# Patient Record
Sex: Female | Born: 1985 | Hispanic: Yes | Marital: Married | Smoking: Never smoker
Health system: Southern US, Community
[De-identification: ages and names within clinical notes are randomized; demographics above are authoritative.]

## PROBLEM LIST (undated history)

## (undated) DIAGNOSIS — Z789 Other specified health status: Secondary | ICD-10-CM

## (undated) HISTORY — PX: NO PAST SURGERIES: SHX2092

---

## 2013-01-07 LAB — OB RESULTS CONSOLE RUBELLA ANTIBODY, IGM: Rubella: IMMUNE

## 2013-01-07 LAB — OB RESULTS CONSOLE ABO/RH: RH TYPE: POSITIVE

## 2013-01-07 LAB — OB RESULTS CONSOLE GC/CHLAMYDIA
Chlamydia: NEGATIVE
Gonorrhea: NEGATIVE

## 2013-01-07 LAB — OB RESULTS CONSOLE RPR: RPR: NONREACTIVE

## 2013-01-07 LAB — OB RESULTS CONSOLE ANTIBODY SCREEN: Antibody Screen: NEGATIVE

## 2013-01-07 LAB — OB RESULTS CONSOLE HEPATITIS B SURFACE ANTIGEN: HEP B S AG: NEGATIVE

## 2013-01-07 LAB — OB RESULTS CONSOLE HIV ANTIBODY (ROUTINE TESTING): HIV: NONREACTIVE

## 2013-01-08 ENCOUNTER — Inpatient Hospital Stay (HOSPITAL_COMMUNITY): Admission: AD | Admit: 2013-01-08 | Payer: Self-pay | Source: Ambulatory Visit | Admitting: Obstetrics

## 2013-07-15 NOTE — L&D Delivery Note (Signed)
Delivery Note At 12:45 AM a viable female was delivered via Vaginal, Vacuum (Extractor) (Presentation: ;  ).  APGAR: 7, 9; weight .   Placenta status: Intact, Spontaneous.  Cord: Unknown with the following complications: None.  Cord pH: not done  Anesthesia: Epidural  Episiotomy: Median Lacerations: 3rd degree Suture Repair: 2.0 vicryl Est. Blood Loss (mL): 250  Mom to postpartum.  Baby to Couplet care / Skin to Skin.  Jamey Harman A 08/30/2013, 1:07 AM

## 2013-08-27 ENCOUNTER — Encounter (HOSPITAL_COMMUNITY): Payer: Self-pay | Admitting: *Deleted

## 2013-08-27 ENCOUNTER — Telehealth (HOSPITAL_COMMUNITY): Payer: Self-pay | Admitting: *Deleted

## 2013-08-27 NOTE — Telephone Encounter (Signed)
Preadmission screen  

## 2013-08-27 NOTE — Telephone Encounter (Signed)
Interpreter number [phone]

## 2013-08-29 ENCOUNTER — Encounter (HOSPITAL_COMMUNITY): Payer: Medicaid Other | Admitting: Anesthesiology

## 2013-08-29 ENCOUNTER — Encounter (HOSPITAL_COMMUNITY): Payer: Self-pay

## 2013-08-29 ENCOUNTER — Inpatient Hospital Stay (HOSPITAL_COMMUNITY): Payer: Medicaid Other | Admitting: Anesthesiology

## 2013-08-29 ENCOUNTER — Inpatient Hospital Stay (HOSPITAL_COMMUNITY)
Admission: RE | Admit: 2013-08-29 | Discharge: 2013-08-31 | DRG: 775 | Disposition: A | Discharge status: Home / Self Care | Payer: Medicaid Other | Source: Ambulatory Visit | Attending: Obstetrics | Admitting: Obstetrics

## 2013-08-29 DIAGNOSIS — O480 Post-term pregnancy: Principal | ICD-10-CM | POA: Diagnosis present

## 2013-08-29 DIAGNOSIS — Z3490 Encounter for supervision of normal pregnancy, unspecified, unspecified trimester: Secondary | ICD-10-CM

## 2013-08-29 DIAGNOSIS — O0990 Supervision of high risk pregnancy, unspecified, unspecified trimester: Secondary | ICD-10-CM

## 2013-08-29 HISTORY — DX: Other specified health status: Z78.9

## 2013-08-29 LAB — RPR: RPR Ser Ql: NONREACTIVE

## 2013-08-29 LAB — CBC
HCT: 35.7 % — ABNORMAL LOW (ref 36.0–46.0)
Hemoglobin: 12 g/dL (ref 12.0–15.0)
MCH: 28 pg (ref 26.0–34.0)
MCHC: 33.6 g/dL (ref 30.0–36.0)
MCV: 83.4 fL (ref 78.0–100.0)
PLATELETS: 156 10*3/uL (ref 150–400)
RBC: 4.28 MIL/uL (ref 3.87–5.11)
RDW: 14.4 % (ref 11.5–15.5)
WBC: 7.5 10*3/uL (ref 4.0–10.5)

## 2013-08-29 LAB — OB RESULTS CONSOLE GBS: STREP GROUP B AG: NEGATIVE

## 2013-08-29 LAB — TYPE AND SCREEN
ABO/RH(D): O POS
Antibody Screen: NEGATIVE

## 2013-08-29 LAB — ABO/RH: ABO/RH(D): O POS

## 2013-08-29 MED ORDER — LACTATED RINGERS IV SOLN: 500.0000 mL | INTRAVENOUS | Status: DC | PRN

## 2013-08-29 MED ORDER — LACTATED RINGERS IV SOLN
INTRAVENOUS | Status: DC
  Administered 2013-08-29 (×2): via INTRAVENOUS

## 2013-08-29 MED ORDER — LIDOCAINE HCL (PF) 1 % IJ SOLN
30.0000 mL | INTRAMUSCULAR | Status: DC | PRN
  Administered 2013-08-30: 30 mL via SUBCUTANEOUS
  Filled 2013-08-29: qty 30

## 2013-08-29 MED ORDER — LIDOCAINE HCL (PF) 1 % IJ SOLN
INTRAMUSCULAR | Status: DC | PRN
  Administered 2013-08-29 (×2): 9 mL

## 2013-08-29 MED ORDER — OXYTOCIN BOLUS FROM INFUSION
500.0000 mL | INTRAVENOUS | Status: DC
  Administered 2013-08-30: 500 mL via INTRAVENOUS

## 2013-08-29 MED ORDER — LACTATED RINGERS IV SOLN: 500.0000 mL | Freq: Once | INTRAVENOUS | Status: DC

## 2013-08-29 MED ORDER — EPHEDRINE 5 MG/ML INJ
10.0000 mg | INTRAVENOUS | Status: DC | PRN
  Filled 2013-08-29: qty 2
  Filled 2013-08-29: qty 4

## 2013-08-29 MED ORDER — DIPHENHYDRAMINE HCL 50 MG/ML IJ SOLN: 12.5000 mg | INTRAMUSCULAR | Status: DC | PRN

## 2013-08-29 MED ORDER — OXYTOCIN 40 UNITS IN LACTATED RINGERS INFUSION - SIMPLE MED
62.5000 mL/h | INTRAVENOUS | Status: DC
  Administered 2013-08-30: 62.5 mL/h via INTRAVENOUS

## 2013-08-29 MED ORDER — FENTANYL 2.5 MCG/ML BUPIVACAINE 1/10 % EPIDURAL INFUSION (WH - ANES)
14.0000 mL/h | INTRAMUSCULAR | Status: DC | PRN
  Filled 2013-08-29: qty 125

## 2013-08-29 MED ORDER — IBUPROFEN 600 MG PO TABS: 600.0000 mg | ORAL_TABLET | Freq: Four times a day (QID) | ORAL | Status: DC | PRN

## 2013-08-29 MED ORDER — EPHEDRINE 5 MG/ML INJ
10.0000 mg | INTRAVENOUS | Status: DC | PRN
  Filled 2013-08-29: qty 2

## 2013-08-29 MED ORDER — FLEET ENEMA 7-19 GM/118ML RE ENEM: 1.0000 | ENEMA | RECTAL | Status: DC | PRN

## 2013-08-29 MED ORDER — BUTORPHANOL TARTRATE 1 MG/ML IJ SOLN
1.0000 mg | INTRAMUSCULAR | Status: DC | PRN
  Administered 2013-08-29 (×2): 1 mg via INTRAVENOUS
  Filled 2013-08-29 (×2): qty 1

## 2013-08-29 MED ORDER — CITRIC ACID-SODIUM CITRATE 334-500 MG/5ML PO SOLN: 30.0000 mL | ORAL | Status: DC | PRN

## 2013-08-29 MED ORDER — PHENYLEPHRINE 40 MCG/ML (10ML) SYRINGE FOR IV PUSH (FOR BLOOD PRESSURE SUPPORT)
80.0000 ug | PREFILLED_SYRINGE | INTRAVENOUS | Status: DC | PRN
  Filled 2013-08-29: qty 2

## 2013-08-29 MED ORDER — ACETAMINOPHEN 325 MG PO TABS: 650.0000 mg | ORAL_TABLET | ORAL | Status: DC | PRN

## 2013-08-29 MED ORDER — ONDANSETRON HCL 4 MG/2ML IJ SOLN: 4.0000 mg | Freq: Four times a day (QID) | INTRAMUSCULAR | Status: DC | PRN

## 2013-08-29 MED ORDER — TERBUTALINE SULFATE 1 MG/ML IJ SOLN
0.2500 mg | Freq: Once | INTRAMUSCULAR | Status: AC | PRN
Start: 2013-08-29 — End: 2013-08-29

## 2013-08-29 MED ORDER — FENTANYL 2.5 MCG/ML BUPIVACAINE 1/10 % EPIDURAL INFUSION (WH - ANES)
INTRAMUSCULAR | Status: DC | PRN
  Administered 2013-08-29: 14 mL/h via EPIDURAL

## 2013-08-29 MED ORDER — PROMETHAZINE HCL 25 MG/ML IJ SOLN
12.5000 mg | Freq: Four times a day (QID) | INTRAMUSCULAR | Status: DC | PRN
  Administered 2013-08-29: 12.5 mg via INTRAVENOUS
  Filled 2013-08-29: qty 1

## 2013-08-29 MED ORDER — PHENYLEPHRINE 40 MCG/ML (10ML) SYRINGE FOR IV PUSH (FOR BLOOD PRESSURE SUPPORT)
80.0000 ug | PREFILLED_SYRINGE | INTRAVENOUS | Status: DC | PRN
  Filled 2013-08-29: qty 10
  Filled 2013-08-29: qty 2

## 2013-08-29 MED ORDER — OXYTOCIN 40 UNITS IN LACTATED RINGERS INFUSION - SIMPLE MED
1.0000 m[IU]/min | INTRAVENOUS | Status: DC
Start: 2013-08-29 — End: 2013-08-30
  Administered 2013-08-29: 2 m[IU]/min via INTRAVENOUS
  Filled 2013-08-29: qty 1000

## 2013-08-29 MED ORDER — OXYCODONE-ACETAMINOPHEN 5-325 MG PO TABS: 1.0000 | ORAL_TABLET | ORAL | Status: DC | PRN

## 2013-08-29 NOTE — Anesthesia Procedure Notes (Signed)
Epidural Patient location during procedure: OB Start time: 08/29/2013 5:55 PM End time: 08/29/2013 5:59 PM  Staffing Anesthesiologist: Leilani AbleHATCHETT, Apurva Reily Performed by: anesthesiologist   Preanesthetic Checklist Completed: patient identified, surgical consent, pre-op evaluation, timeout performed, IV checked, risks and benefits discussed and monitors and equipment checked  Epidural Patient position: sitting Prep: site prepped and draped and DuraPrep Patient monitoring: continuous pulse ox and blood pressure Approach: midline Injection technique: LOR air  Needle:  Needle type: Tuohy  Needle gauge: 17 G Needle length: 9 cm and 9 Needle insertion depth: 5 cm cm Catheter type: closed end flexible Catheter size: 19 Gauge Catheter at skin depth: 10 cm Test dose: negative and Other  Assessment Sensory level: T9 Events: blood not aspirated, injection not painful, no injection resistance, negative IV test and no paresthesia  Additional Notes Reason for block:procedure for pain

## 2013-08-29 NOTE — Anesthesia Preprocedure Evaluation (Signed)
Anesthesia Evaluation  Patient identified by MRN, date of birth, ID band Patient awake    Reviewed: Allergy & Precautions, H&P , NPO status , Patient's Chart, lab work & pertinent test results  Airway Mallampati: II TM Distance: >3 FB Neck ROM: full    Dental no notable dental hx.    Pulmonary neg pulmonary ROS,    Pulmonary exam normal       Cardiovascular negative cardio ROS      Neuro/Psych negative neurological ROS  negative psych ROS   GI/Hepatic negative GI ROS, Neg liver ROS,   Endo/Other  negative endocrine ROS  Renal/GU negative Renal ROS  negative genitourinary   Musculoskeletal negative musculoskeletal ROS (+)   Abdominal Normal abdominal exam  (+)   Peds  Hematology negative hematology ROS (+)   Anesthesia Other Findings   Reproductive/Obstetrics (+) Pregnancy                           Anesthesia Physical Anesthesia Plan  ASA: II  Anesthesia Plan: Epidural   Post-op Pain Management:    Induction:   Airway Management Planned:   Additional Equipment:   Intra-op Plan:   Post-operative Plan:   Informed Consent: I have reviewed the patients History and Physical, chart, labs and discussed the procedure including the risks, benefits and alternatives for the proposed anesthesia with the patient or authorized representative who has indicated his/her understanding and acceptance.     Plan Discussed with:   Anesthesia Plan Comments:         Anesthesia Quick Evaluation  

## 2013-08-29 NOTE — H&P (Signed)
This is Dr. Francoise CeoBernard Arsenio Schnorr dictating the history and physical on  Emily [name] she is a 28 year old gravida 1 at 41 weeks and 2 days EDC to 6:15 negative GBS brought in for induction cervix is 3 cm 90% 0 station amniotomy performed the fluids clear she is on low-dose Pitocin contracting irregularly Past medical history negative Past surgical history negative Social history negative System review noncontributory Physical exam well-developed female in no distress HEENT negative Lungs clear to P&A Heart regular rhythm no murmurs no gallops Breasts negative Abdomen term Extremities negative and and and and

## 2013-08-30 ENCOUNTER — Encounter (HOSPITAL_COMMUNITY): Payer: Self-pay

## 2013-08-30 LAB — CBC
HCT: 30.3 % — ABNORMAL LOW (ref 36.0–46.0)
Hemoglobin: 10.1 g/dL — ABNORMAL LOW (ref 12.0–15.0)
MCH: 28 pg (ref 26.0–34.0)
MCHC: 33.3 g/dL (ref 30.0–36.0)
MCV: 83.9 fL (ref 78.0–100.0)
PLATELETS: 141 10*3/uL — AB (ref 150–400)
RBC: 3.61 MIL/uL — AB (ref 3.87–5.11)
RDW: 14.5 % (ref 11.5–15.5)
WBC: 13 10*3/uL — ABNORMAL HIGH (ref 4.0–10.5)

## 2013-08-30 MED ORDER — LANOLIN HYDROUS EX OINT: TOPICAL_OINTMENT | CUTANEOUS | Status: DC | PRN

## 2013-08-30 MED ORDER — INFLUENZA VAC SPLIT QUAD 0.5 ML IM SUSP
0.5000 mL | INTRAMUSCULAR | Status: AC
  Administered 2013-08-30: 0.5 mL via INTRAMUSCULAR

## 2013-08-30 MED ORDER — IBUPROFEN 600 MG PO TABS
600.0000 mg | ORAL_TABLET | Freq: Four times a day (QID) | ORAL | Status: DC
  Administered 2013-08-30 – 2013-08-31 (×7): 600 mg via ORAL
  Filled 2013-08-30 (×7): qty 1

## 2013-08-30 MED ORDER — ONDANSETRON HCL 4 MG PO TABS: 4.0000 mg | ORAL_TABLET | ORAL | Status: DC | PRN

## 2013-08-30 MED ORDER — TETANUS-DIPHTH-ACELL PERTUSSIS 5-2.5-18.5 LF-MCG/0.5 IM SUSP
0.5000 mL | Freq: Once | INTRAMUSCULAR | Status: AC
  Administered 2013-08-30: 0.5 mL via INTRAMUSCULAR

## 2013-08-30 MED ORDER — SIMETHICONE 80 MG PO CHEW: 80.0000 mg | CHEWABLE_TABLET | ORAL | Status: DC | PRN

## 2013-08-30 MED ORDER — PRENATAL MULTIVITAMIN CH
1.0000 | ORAL_TABLET | Freq: Every day | ORAL | Status: DC
  Administered 2013-08-30 – 2013-08-31 (×2): 1 via ORAL
  Filled 2013-08-30 (×2): qty 1

## 2013-08-30 MED ORDER — FERROUS SULFATE 325 (65 FE) MG PO TABS
325.0000 mg | ORAL_TABLET | Freq: Two times a day (BID) | ORAL | Status: DC
  Administered 2013-08-30 – 2013-08-31 (×4): 325 mg via ORAL
  Filled 2013-08-30 (×4): qty 1

## 2013-08-30 MED ORDER — SENNOSIDES-DOCUSATE SODIUM 8.6-50 MG PO TABS
2.0000 | ORAL_TABLET | ORAL | Status: DC
  Administered 2013-08-30: 2 via ORAL
  Filled 2013-08-30: qty 2

## 2013-08-30 MED ORDER — DIBUCAINE 1 % RE OINT: 1.0000 "application " | TOPICAL_OINTMENT | RECTAL | Status: DC | PRN

## 2013-08-30 MED ORDER — ONDANSETRON HCL 4 MG/2ML IJ SOLN: 4.0000 mg | INTRAMUSCULAR | Status: DC | PRN

## 2013-08-30 MED ORDER — DIPHENHYDRAMINE HCL 25 MG PO CAPS: 25.0000 mg | ORAL_CAPSULE | Freq: Four times a day (QID) | ORAL | Status: DC | PRN

## 2013-08-30 MED ORDER — BENZOCAINE-MENTHOL 20-0.5 % EX AERO
1.0000 "application " | INHALATION_SPRAY | CUTANEOUS | Status: DC | PRN
  Filled 2013-08-30: qty 56

## 2013-08-30 MED ORDER — OXYCODONE-ACETAMINOPHEN 5-325 MG PO TABS: 1.0000 | ORAL_TABLET | ORAL | Status: DC | PRN

## 2013-08-30 MED ORDER — ZOLPIDEM TARTRATE 5 MG PO TABS
5.0000 mg | ORAL_TABLET | Freq: Every evening | ORAL | Status: DC | PRN
Start: 2013-08-30 — End: 2013-08-31

## 2013-08-30 MED ORDER — WITCH HAZEL-GLYCERIN EX PADS: 1.0000 "application " | MEDICATED_PAD | CUTANEOUS | Status: DC | PRN

## 2013-08-30 NOTE — Progress Notes (Signed)
Ur chart review completed.  

## 2013-08-30 NOTE — Progress Notes (Signed)
Patient ID: Emily AcresMaria Laguna-Espitia, female   DOB: 08/10/1985, 28 y.o.   MRN: [id number] Postpartum day 0 Vital signs normal Fundus firm Doing well and and and and

## 2013-08-30 NOTE — Anesthesia Postprocedure Evaluation (Signed)
Anesthesia Post Note  Patient: Emily [name]  Procedure(s) Performed: * No procedures listed *  Anesthesia type: Epidural  Patient location: Mother/Baby  Post pain: Pain level controlled  Post assessment: Post-op Vital signs reviewed  Last Vitals:  Filed Vitals:   08/30/13 0500  BP: 107/66  Pulse: 100  Temp: 36.9 C  Resp: 20    Post vital signs: Reviewed  Level of consciousness: awake  Complications: No apparent anesthesia complications

## 2013-08-30 NOTE — Lactation Note (Signed)
This note was copied from the chart of Emily [name]. Lactation Consultation Note Initial consult:  Baby 12 hours old.  English speaking niece present and mother comfortable with her translating.  Niece has experience breastfeeding and good support for mother.  Reviewed hand expression, teach back completed, mother has good flow of colostrum.  Assisted mother in placing baby in football hold with breast compression and breast massage.  Baby sucked but did not maintain latch.  Attempted with #20 nipple shield.  Baby latched off and on for 8 min.  Scant amount of colostrum in shield.  Baby no longer cueing at this time. Reviewed nipple shield use and care and the possible need for outpatient appointment if he continues to breast feed with NS.  Reviewed basics, supply and demand, Baby & Me booklet, lactation support services and brochure.  Encouraged mother to call for assistance with next feeding.   Patient Name: Emily [name] ONGEX'BToday's Date: 08/30/2013 Reason for consult: Initial assessment   Maternal Data Has patient been taught Hand Expression?: Yes  Feeding Feeding Type: Breast Fed  LATCH Score/Interventions Latch: Repeated attempts needed to sustain latch, nipple held in mouth throughout feeding, stimulation needed to elicit sucking reflex. Intervention(s): Adjust position;Assist with latch;Breast massage;Breast compression  Audible Swallowing: A few with stimulation Intervention(s): Hand expression  Type of Nipple: Everted at rest and after stimulation  Comfort (Breast/Nipple): Filling, red/small blisters or bruises, mild/mod discomfort  Problem noted: Mild/Moderate discomfort Interventions (Mild/moderate discomfort): Hand expression  Hold (Positioning): Assistance needed to correctly position infant at breast and maintain latch. Intervention(s): Breastfeeding basics reviewed;Support Pillows;Position options;Skin to skin  LATCH Score: 6  Lactation Tools  Discussed/Used Tools: Nipple Shields Nipple shield size: 20   Consult Status Consult Status: Follow-up Date: 08/30/13 Follow-up type: In-patient    Dahlia ByesBerkelhammer, Ruth Labette HealthBoschen 08/30/2013, 1:16 PM

## 2013-08-31 NOTE — Discharge Instructions (Signed)
Discharge instructions ° °· You can wash your hair °· Shower °· Eat what you want °· Drink what you want °· See me in 6 weeks °· Your ankles are going to swell more in the next 2 weeks than when pregnant °· No sex for 6 weeks ° ° °Donaldson Richter A, MD 08/31/2013 ° ° °

## 2013-08-31 NOTE — Discharge Summary (Signed)
Obstetric Discharge Summary Reason for Admission: induction of labor Prenatal Procedures: none Intrapartum Procedures: spontaneous vaginal delivery Postpartum Procedures: none Complications-Operative and Postpartum: none Hemoglobin  Date Value Ref Range Status  08/30/2013 10.1* 12.0 - 15.0 g/dL Final     HCT  Date Value Ref Range Status  08/30/2013 30.3* 36.0 - 46.0 % Final    Physical Exam:  General: alert Lochia: appropriate Uterine Fundus: firm Incision: healing well DVT Evaluation: No evidence of DVT seen on physical exam.  Discharge Diagnoses: Term Pregnancy-delivered  Discharge Information: Date: 08/31/2013 Activity: pelvic rest Diet: routine Medications: Percocet Condition: stable Instructions: refer to practice specific booklet Discharge to: home Follow-up Information   Follow up with Kathreen CosierMARSHALL,Willye Javier A, MD.   Specialty:  Obstetrics and Gynecology   Contact information:   [address] [place] [address] [phone]       Newborn Data: Live born female  Birth Weight: 7 lb 12 oz (3515 g) APGAR: 7, 9  Home with mother.  Kree Armato A 08/31/2013, 7:31 AM

## 2014-05-16 ENCOUNTER — Encounter (HOSPITAL_COMMUNITY): Payer: Self-pay

## 2015-03-28 LAB — OB RESULTS CONSOLE ABO/RH: RH TYPE: POSITIVE

## 2015-03-28 LAB — OB RESULTS CONSOLE RUBELLA ANTIBODY, IGM: Rubella: IMMUNE

## 2015-03-28 LAB — OB RESULTS CONSOLE HIV ANTIBODY (ROUTINE TESTING): HIV: NONREACTIVE

## 2015-03-28 LAB — OB RESULTS CONSOLE RPR: RPR: NONREACTIVE

## 2015-03-28 LAB — PROCEDURE REPORT - SCANNED: PAP SMEAR: NEGATIVE

## 2015-03-28 LAB — OB RESULTS CONSOLE HEPATITIS B SURFACE ANTIGEN: Hepatitis B Surface Ag: NEGATIVE

## 2015-03-28 LAB — OB RESULTS CONSOLE ANTIBODY SCREEN: Antibody Screen: NEGATIVE

## 2015-07-16 NOTE — L&D Delivery Note (Addendum)
Delivery Note At 12:48 PM a viable female was delivered via Vaginal, Spontaneous Delivery (Presentation: ;  ).  APGAR: , ; weight  .   Placenta status: , .  Cord:  with the following complications: .  Cord pH: not done  Anesthesia: Epidural  Episiotomy: None Lacerations: 2nd degreeperineal Suture Repair: 2.0 vicryl Est. Blood Loss (mL):    Mom to postpartum.  Baby to Couplet care / Skin to Skin.  Emily [name] A 10/19/2015, 1:00 PM

## 2015-08-19 ENCOUNTER — Inpatient Hospital Stay (HOSPITAL_COMMUNITY): Admission: AD | Admit: 2015-08-19 | Payer: Medicaid Other | Source: Ambulatory Visit | Admitting: Obstetrics

## 2015-10-17 ENCOUNTER — Other Ambulatory Visit: Payer: Self-pay | Admitting: Obstetrics

## 2015-10-18 ENCOUNTER — Telehealth (HOSPITAL_COMMUNITY): Payer: Self-pay | Admitting: *Deleted

## 2015-10-18 ENCOUNTER — Encounter (HOSPITAL_COMMUNITY): Payer: Self-pay | Admitting: *Deleted

## 2015-10-18 LAB — OB RESULTS CONSOLE GBS: STREP GROUP B AG: NEGATIVE

## 2015-10-18 NOTE — Telephone Encounter (Signed)
Preadmission screen Interpreter number [id number]

## 2015-10-19 ENCOUNTER — Encounter (HOSPITAL_COMMUNITY): Payer: Self-pay

## 2015-10-19 ENCOUNTER — Inpatient Hospital Stay (HOSPITAL_COMMUNITY): Payer: Medicaid Other | Admitting: Anesthesiology

## 2015-10-19 ENCOUNTER — Inpatient Hospital Stay (HOSPITAL_COMMUNITY)
Admission: AD | Admit: 2015-10-19 | Discharge: 2015-10-20 | DRG: 775 | Disposition: A | Discharge status: Home / Self Care | Payer: Medicaid Other | Source: Ambulatory Visit | Attending: Obstetrics | Admitting: Obstetrics

## 2015-10-19 DIAGNOSIS — Z3A40 40 weeks gestation of pregnancy: Secondary | ICD-10-CM

## 2015-10-19 DIAGNOSIS — Z23 Encounter for immunization: Secondary | ICD-10-CM

## 2015-10-19 LAB — TYPE AND SCREEN
ABO/RH(D): O POS
Antibody Screen: NEGATIVE

## 2015-10-19 LAB — CBC
HEMATOCRIT: 32.4 % — AB (ref 36.0–46.0)
Hemoglobin: 10.7 g/dL — ABNORMAL LOW (ref 12.0–15.0)
MCH: 25.7 pg — ABNORMAL LOW (ref 26.0–34.0)
MCHC: 33 g/dL (ref 30.0–36.0)
MCV: 77.7 fL — AB (ref 78.0–100.0)
Platelets: 191 10*3/uL (ref 150–400)
RBC: 4.17 MIL/uL (ref 3.87–5.11)
RDW: 15.1 % (ref 11.5–15.5)
WBC: 7.2 10*3/uL (ref 4.0–10.5)

## 2015-10-19 LAB — RPR: RPR Ser Ql: NONREACTIVE

## 2015-10-19 MED ORDER — ONDANSETRON HCL 4 MG/2ML IJ SOLN: 4.0000 mg | Freq: Four times a day (QID) | INTRAMUSCULAR | Status: DC | PRN

## 2015-10-19 MED ORDER — SIMETHICONE 80 MG PO CHEW: 80.0000 mg | CHEWABLE_TABLET | ORAL | Status: DC | PRN

## 2015-10-19 MED ORDER — DIPHENHYDRAMINE HCL 25 MG PO CAPS: 25.0000 mg | ORAL_CAPSULE | Freq: Four times a day (QID) | ORAL | Status: DC | PRN

## 2015-10-19 MED ORDER — LIDOCAINE HCL (PF) 1 % IJ SOLN
30.0000 mL | INTRAMUSCULAR | Status: DC | PRN
  Filled 2015-10-19: qty 30

## 2015-10-19 MED ORDER — SENNOSIDES-DOCUSATE SODIUM 8.6-50 MG PO TABS
2.0000 | ORAL_TABLET | ORAL | Status: DC
  Administered 2015-10-19: 2 via ORAL
  Filled 2015-10-19: qty 2

## 2015-10-19 MED ORDER — PRENATAL MULTIVITAMIN CH
1.0000 | ORAL_TABLET | Freq: Every day | ORAL | Status: DC
  Administered 2015-10-20: 1 via ORAL
  Filled 2015-10-19: qty 1

## 2015-10-19 MED ORDER — CITRIC ACID-SODIUM CITRATE 334-500 MG/5ML PO SOLN: 30.0000 mL | ORAL | Status: DC | PRN

## 2015-10-19 MED ORDER — PHENYLEPHRINE 40 MCG/ML (10ML) SYRINGE FOR IV PUSH (FOR BLOOD PRESSURE SUPPORT)
80.0000 ug | PREFILLED_SYRINGE | INTRAVENOUS | Status: DC | PRN
  Filled 2015-10-19: qty 2

## 2015-10-19 MED ORDER — LACTATED RINGERS IV SOLN
2.5000 [IU]/h | INTRAVENOUS | Status: DC
  Administered 2015-10-19: 2.5 [IU]/h via INTRAVENOUS
  Filled 2015-10-19: qty 4

## 2015-10-19 MED ORDER — ZOLPIDEM TARTRATE 5 MG PO TABS: 5.0000 mg | ORAL_TABLET | Freq: Every evening | ORAL | Status: DC | PRN

## 2015-10-19 MED ORDER — DIBUCAINE 1 % RE OINT: 1.0000 "application " | TOPICAL_OINTMENT | RECTAL | Status: DC | PRN

## 2015-10-19 MED ORDER — BUTORPHANOL TARTRATE 1 MG/ML IJ SOLN: 1.0000 mg | INTRAMUSCULAR | Status: DC | PRN

## 2015-10-19 MED ORDER — FLEET ENEMA 7-19 GM/118ML RE ENEM: 1.0000 | ENEMA | RECTAL | Status: DC | PRN

## 2015-10-19 MED ORDER — ONDANSETRON HCL 4 MG PO TABS: 4.0000 mg | ORAL_TABLET | ORAL | Status: DC | PRN

## 2015-10-19 MED ORDER — TETANUS-DIPHTH-ACELL PERTUSSIS 5-2.5-18.5 LF-MCG/0.5 IM SUSP
0.5000 mL | Freq: Once | INTRAMUSCULAR | Status: AC
Start: 2015-10-20 — End: 2015-10-20
  Administered 2015-10-20: 0.5 mL via INTRAMUSCULAR
  Filled 2015-10-19: qty 0.5

## 2015-10-19 MED ORDER — ACETAMINOPHEN 325 MG PO TABS: 650.0000 mg | ORAL_TABLET | ORAL | Status: DC | PRN

## 2015-10-19 MED ORDER — OXYCODONE-ACETAMINOPHEN 5-325 MG PO TABS: 1.0000 | ORAL_TABLET | ORAL | Status: DC | PRN

## 2015-10-19 MED ORDER — IBUPROFEN 600 MG PO TABS
600.0000 mg | ORAL_TABLET | Freq: Four times a day (QID) | ORAL | Status: DC
  Administered 2015-10-19 – 2015-10-20 (×4): 600 mg via ORAL
  Filled 2015-10-19 (×4): qty 1

## 2015-10-19 MED ORDER — LACTATED RINGERS IV SOLN
500.0000 mL | Freq: Once | INTRAVENOUS | Status: AC
  Administered 2015-10-19: 500 mL via INTRAVENOUS

## 2015-10-19 MED ORDER — LACTATED RINGERS IV SOLN: 500.0000 mL | INTRAVENOUS | Status: DC | PRN

## 2015-10-19 MED ORDER — LIDOCAINE HCL (PF) 1 % IJ SOLN
INTRAMUSCULAR | Status: DC | PRN
  Administered 2015-10-19 (×2): 8 mL via EPIDURAL

## 2015-10-19 MED ORDER — OXYTOCIN BOLUS FROM INFUSION
500.0000 mL | INTRAVENOUS | Status: DC
  Administered 2015-10-19: 500 mL via INTRAVENOUS

## 2015-10-19 MED ORDER — FENTANYL 2.5 MCG/ML BUPIVACAINE 1/10 % EPIDURAL INFUSION (WH - ANES)
14.0000 mL/h | INTRAMUSCULAR | Status: DC | PRN
  Administered 2015-10-19: 14 mL/h via EPIDURAL
  Filled 2015-10-19: qty 125

## 2015-10-19 MED ORDER — EPHEDRINE 5 MG/ML INJ
10.0000 mg | INTRAVENOUS | Status: DC | PRN
  Filled 2015-10-19: qty 2

## 2015-10-19 MED ORDER — FERROUS SULFATE 325 (65 FE) MG PO TABS
325.0000 mg | ORAL_TABLET | Freq: Two times a day (BID) | ORAL | Status: DC
  Administered 2015-10-19 – 2015-10-20 (×2): 325 mg via ORAL
  Filled 2015-10-19 (×2): qty 1

## 2015-10-19 MED ORDER — ONDANSETRON HCL 4 MG/2ML IJ SOLN: 4.0000 mg | INTRAMUSCULAR | Status: DC | PRN

## 2015-10-19 MED ORDER — PHENYLEPHRINE 40 MCG/ML (10ML) SYRINGE FOR IV PUSH (FOR BLOOD PRESSURE SUPPORT)
80.0000 ug | PREFILLED_SYRINGE | INTRAVENOUS | Status: DC | PRN
  Filled 2015-10-19: qty 20
  Filled 2015-10-19: qty 2

## 2015-10-19 MED ORDER — WITCH HAZEL-GLYCERIN EX PADS: 1.0000 "application " | MEDICATED_PAD | CUTANEOUS | Status: DC | PRN

## 2015-10-19 MED ORDER — LACTATED RINGERS IV SOLN
INTRAVENOUS | Status: DC
Start: 2015-10-19 — End: 2015-10-19
  Administered 2015-10-19: 125 mL/h via INTRAVENOUS

## 2015-10-19 MED ORDER — LANOLIN HYDROUS EX OINT: TOPICAL_OINTMENT | CUTANEOUS | Status: DC | PRN

## 2015-10-19 MED ORDER — OXYCODONE-ACETAMINOPHEN 5-325 MG PO TABS: 2.0000 | ORAL_TABLET | ORAL | Status: DC | PRN

## 2015-10-19 MED ORDER — DIPHENHYDRAMINE HCL 50 MG/ML IJ SOLN: 12.5000 mg | INTRAMUSCULAR | Status: DC | PRN

## 2015-10-19 MED ORDER — BENZOCAINE-MENTHOL 20-0.5 % EX AERO: 1.0000 "application " | INHALATION_SPRAY | CUTANEOUS | Status: DC | PRN

## 2015-10-19 NOTE — Progress Notes (Signed)
Benita Research officer, trade unionpanish Interpreter used for assessment & answering questions.

## 2015-10-19 NOTE — H&P (Signed)
This is Dr. Francoise CeoBernard Jariah Tarkowski dictating the history and physical on  Emily [name]  she's a 30 year old gravida 2 para 101 EDC 10/14/2015 negative GBS she's 40 weeks and 5 days admitted in labor she is 5 cm 90% vertex -2 station she has her epidural and amniotomy was performed the fluids clear she is contracting every 3-4 minutes Past medical history negative Past surgical history negative Social history negative System review negative Physical exam well-developed female in labor HEENT negative Lungs clear to P&A Heart regular rhythm no murmurs no gallops Breasts negative Abdomen term Pelvic as described above Extremities negative

## 2015-10-19 NOTE — Anesthesia Preprocedure Evaluation (Signed)
Anesthesia Evaluation  Patient identified by MRN, date of birth, ID band Patient awake    Reviewed: Allergy & Precautions, H&P , NPO status , Patient's Chart, lab work & pertinent test results  Airway Mallampati: I  TM Distance: >3 FB Neck ROM: full    Dental no notable dental hx.    Pulmonary neg pulmonary ROS,    breath sounds clear to auscultation       Cardiovascular negative cardio ROS Normal cardiovascular exam     Neuro/Psych negative neurological ROS  negative psych ROS   GI/Hepatic negative GI ROS, Neg liver ROS,   Endo/Other  negative endocrine ROS  Renal/GU negative Renal ROS     Musculoskeletal   Abdominal (+) + obese,   Peds  Hematology negative hematology ROS (+)   Anesthesia Other Findings   Reproductive/Obstetrics (+) Pregnancy                             Anesthesia Physical Anesthesia Plan  ASA: II  Anesthesia Plan: Epidural   Post-op Pain Management:    Induction:   Airway Management Planned:   Additional Equipment:   Intra-op Plan:   Post-operative Plan:   Informed Consent: I have reviewed the patients History and Physical, chart, labs and discussed the procedure including the risks, benefits and alternatives for the proposed anesthesia with the patient or authorized representative who has indicated his/her understanding and acceptance.     Plan Discussed with:   Anesthesia Plan Comments:         Anesthesia Quick Evaluation

## 2015-10-19 NOTE — MAU Note (Signed)
Pt reports contractions, some bloody discharge.  

## 2015-10-19 NOTE — Lactation Note (Signed)
This note was copied from a baby's chart. Lactation Consultation Note Report from Rn , mom plans to use hand pump during the night.  Mom expressed 10mls and gave with spoon at last feeding.   Patient Name: Girl Stacie AcresMaria Laguna-Espitia ZOXWR'UToday's Date: 10/19/2015     Maternal Data    Feeding Feeding Type: Breast Fed Length of feed: 10 min  LATCH Score/Interventions                      Lactation Tools Discussed/Used Tools: Nipple [employer]Shields   Consult Status      Shoptaw, [name] 10/19/2015, 10:16 PM

## 2015-10-19 NOTE — Anesthesia Procedure Notes (Signed)
Epidural Patient location during procedure: OB Start time: 10/19/2015 8:02 AM End time: 10/19/2015 8:06 AM  Staffing Anesthesiologist: Leilani AbleHATCHETT, Jenavee Laguardia Performed by: anesthesiologist   Preanesthetic Checklist Completed: patient identified, surgical consent, pre-op evaluation, timeout performed, IV checked, risks and benefits discussed and monitors and equipment checked  Epidural Patient position: sitting Prep: site prepped and draped and DuraPrep Patient monitoring: continuous pulse ox and blood pressure Approach: midline Location: L3-L4 Injection technique: LOR air  Needle:  Needle type: Tuohy  Needle gauge: 17 G Needle length: 9 cm and 9 Needle insertion depth: 6 cm Catheter type: closed end flexible Catheter size: 19 Gauge Catheter at skin depth: 11 cm Test dose: negative and Other  Assessment Sensory level: T9 Events: blood not aspirated, injection not painful, no injection resistance, negative IV test and no paresthesia  Additional Notes Reason for block:procedure for pain

## 2015-10-19 NOTE — Anesthesia Postprocedure Evaluation (Signed)
Anesthesia Post Note  Patient: Emily [name]  Procedure(s) Performed: * No procedures listed *  Patient location during evaluation: Mother Baby Anesthesia Type: Epidural Level of consciousness: awake and alert Pain management: pain level controlled Vital Signs Assessment: post-procedure vital signs reviewed and stable Respiratory status: spontaneous breathing Cardiovascular status: stable Postop Assessment: patient able to bend at knees, adequate PO intake and no signs of nausea or vomiting Anesthetic complications: no    Last Vitals:  Filed Vitals:   10/19/15 1401 10/19/15 1409  BP: 107/67 100/63  Pulse: 81 70  Temp:    Resp:  16    Last Pain:  Filed Vitals:   10/19/15 1415  PainSc: 2                  Kerie Badger Hristova

## 2015-10-19 NOTE — Lactation Note (Signed)
This note was copied from a baby's chart. Lactation Consultation Note Initial visit at 5 hours of age.  LC called for interpreter, but unavailable by phone.  Mom agrees to have family interpret during visit and is aware of medical interpreter if she desires.  Baby had recent feeding of 10 minutes on each breast using NS, but colostrum noted in NS. Mom has requested formula from RN.  LC assisted with hand expression of 4mls easily expressed and then spoon fed to baby and tolerated well.  Mom is able to return demonstration of NS application.   Hand pump helps slightly with breast tissue prior to NS application.  Mom has short everted nipples with areola tucking in with compression.  Semi compressible tissue, likely baby slips off with latch attempt.  Encouraged mom to offer EBm by spoon before offering formula.  Mom to latch baby on demand and attempt without NS.  If mom continues to use NS she should post pump with DEBP.  RN to set up if needed.  Mom pumped for about 1 month with older child.  [facility] given and discussed.  Encouraged to feed with early cues on demand.  Early newborn behavior discussed.  Hand expression demonstrated by mom with colostrum visible.  Mom to call for assist as needed.      Patient Name: Emily [name] ZOXWR'UToday's Date: 10/19/2015 Reason for consult: Initial assessment;Difficult latch   Maternal Data Has patient been taught Hand Expression?: Yes  Feeding Feeding Type: Breast Milk Length of feed:  (in wheelchair on way to room 120)  LATCH Score/Interventions Latch: Grasps breast easily, tongue down, lips flanged, rhythmical sucking. Intervention(s): Adjust position;Assist with latch;Breast compression;Breast massage  Audible Swallowing: A few with stimulation Intervention(s): Skin to skin;Hand expression  Type of Nipple: Flat  Comfort (Breast/Nipple): Soft / non-tender     Hold (Positioning): Assistance needed to correctly position infant at breast  and maintain latch.  LATCH Score: 7  Lactation Tools Discussed/Used Tools: Nipple Shields Nipple shield size: 20   Consult Status Consult Status: Follow-up Date: 10/20/15 Follow-up type: In-patient    [name], [name] 10/19/2015, 6:14 PM

## 2015-10-20 ENCOUNTER — Inpatient Hospital Stay (HOSPITAL_COMMUNITY): Admission: RE | Admit: 2015-10-20 | Payer: Medicaid Other | Source: Ambulatory Visit

## 2015-10-20 LAB — CBC
HCT: 30 % — ABNORMAL LOW (ref 36.0–46.0)
HEMOGLOBIN: 9.8 g/dL — AB (ref 12.0–15.0)
MCH: 25.2 pg — ABNORMAL LOW (ref 26.0–34.0)
MCHC: 32.7 g/dL (ref 30.0–36.0)
MCV: 77.1 fL — ABNORMAL LOW (ref 78.0–100.0)
Platelets: 171 10*3/uL (ref 150–400)
RBC: 3.89 MIL/uL (ref 3.87–5.11)
RDW: 15.1 % (ref 11.5–15.5)
WBC: 7.9 10*3/uL (ref 4.0–10.5)

## 2015-10-20 NOTE — Discharge Summary (Signed)
Obstetric Discharge Summary Reason for Admission: onset of labor Prenatal Procedures: none Intrapartum Procedures: spontaneous vaginal delivery Postpartum Procedures: none Complications-Operative and Postpartum: none HEMOGLOBIN  Date Value Ref Range Status  10/20/2015 9.8* 12.0 - 15.0 g/dL Final   HCT  Date Value Ref Range Status  10/20/2015 30.0* 36.0 - 46.0 % Final    Physical Exam:  General: alert Lochia: appropriate Uterine Fundus: firm Incision: healing well DVT Evaluation: No evidence of DVT seen on physical exam.  Discharge Diagnoses: Term Pregnancy-delivered  Discharge Information: Date: 10/20/2015 Activity: pelvic rest Diet: routine Medications: Percocet Condition: stable Instructions: refer to practice specific booklet Discharge to: home Follow-up Information    Follow up with Kathreen CosierMARSHALL,BERNARD A, MD.   Specialty:  Obstetrics and Gynecology   Contact information:   [address] [place] [address] [phone]       Newborn Data: Live born female  Birth Weight: 6 lb 12.6 oz (3079 g) APGAR: 9, 9  Home with mother.  MARSHALL,BERNARD A 10/20/2015, 7:21 AM

## 2015-10-20 NOTE — Progress Notes (Signed)
Patient ID: Emily AcresMaria Laguna-Espitia, female   DOB: 12/23/1985, 30 y.o.   MRN: [id number] Postpartum day one Blood pressure 131/61 pulse 74 respiration 18 Fundus firm Lochia moderate  Wants  early discharge

## 2015-10-20 NOTE — Progress Notes (Signed)
UR chart review completed.  

## 2015-10-20 NOTE — Lactation Note (Signed)
Lactation Consultation Note  Follow up visit made.  Mom currently giving baby a bottle of formula.  She states baby latches easily.  Mom has a manual pump. She desires to both breast and bottle feed.  Encouraged to call with concerns.  Patient Name: Stacie AcresMaria Laguna-Espitia [id number]'s Date: 10/20/2015     Maternal Data    Feeding    LATCH Score/Interventions                      Lactation Tools Discussed/Used     Consult Status      Huston FoleyMOULDEN, Ziggy Chanthavong S 10/20/2015, 2:15 PM

## 2015-11-06 NOTE — Discharge Summary (Signed)
Obstetric Discharge Summary Reason for Admission: onset of labor Prenatal Procedures: none Intrapartum Procedures: spontaneous vaginal delivery Postpartum Procedures: none Complications-Operative and Postpartum: none HEMOGLOBIN  Date Value Ref Range Status  10/20/2015 9.8* 12.0 - 15.0 g/dL Final   HCT  Date Value Ref Range Status  10/20/2015 30.0* 36.0 - 46.0 % Final    Physical Exam:  General: alert Lochia: appropriate Uterine Fundus: firm Incision: healing well DVT Evaluation: No evidence of DVT seen on physical exam.  Discharge Diagnoses: Term Pregnancy-delivered  Discharge Information: Date: 11/06/2015 Activity: pelvic rest Diet: routine Medications: Percocet Condition: improved Instructions: refer to practice specific booklet Discharge to: home Follow-up Information    Follow up with Kathreen CosierMARSHALL,Dashon Mcintire A, MD.   Specialty:  Obstetrics and Gynecology   Contact information:   [address] [place] [address] [phone]       Follow up with Kathreen CosierMARSHALL,Elena Cothern A, MD.   Specialty:  Obstetrics and Gynecology   Contact information:   [address] [place] [address] [phone]       Newborn Data: Live born female  Birth Weight: 6 lb 12.6 oz (3079 g) APGAR: 9, 9  Home with mother.  Erland Vivas A 11/06/2015, 7:22 AM

## 2015-11-06 NOTE — Discharge Instructions (Signed)
Discharge instructions   You can wash your hair  Shower  Eat what you want  Drink what you want  See me in 6 weeks  Your ankles are going to swell more in the next 2 weeks than when pregnant  No sex for 6 weeks   Aimy Sweeting A, MD 10/20/2015  Discharge instructions   You can wash your hair  Shower  Eat what you want  Drink what you want  See me in 6 weeks  Your ankles are going to swell more in the next 2 weeks than when pregnant  No sex for 6 weeks   Jakaleb Payer A, MD 11/06/2015

## 2015-11-08 NOTE — Discharge Summary (Signed)
Obstetric Discharge Summary Reason for Admission: onset of labor Prenatal Procedures: none Intrapartum Procedures: spontaneous vaginal delivery Postpartum Procedures: none Complications-Operative and Postpartum: none HEMOGLOBIN  Date Value Ref Range Status  10/20/2015 9.8* 12.0 - 15.0 g/dL Final   HCT  Date Value Ref Range Status  10/20/2015 30.0* 36.0 - 46.0 % Final    Physical Exam:  General: alert Lochia: appropriate Uterine Fundus: firm Incision: healing well DVT Evaluation: No evidence of DVT seen on physical exam.  Discharge Diagnoses: Term Pregnancy-delivered  Discharge Information: Date: 11/08/2015 Activity: pelvic rest Diet: routine Medications: Iron and Percocet Condition: improved Instructions: refer to practice specific booklet Discharge to: home Follow-up Information    Follow up with Kathreen CosierMARSHALL,BERNARD A, MD.   Specialty:  Obstetrics and Gynecology   Contact information:   [address] [place] [address] [phone]       Follow up with Kathreen CosierMARSHALL,BERNARD A, MD.   Specialty:  Obstetrics and Gynecology   Contact information:   [address] [place] [address] [phone]       Newborn Data: Live born female  Birth Weight: 6 lb 12.6 oz (3079 g) APGAR: 9, 9  Home with mother.  MARSHALL,BERNARD A 11/08/2015, 8:53 AM

## 2016-04-09 ENCOUNTER — Encounter: Payer: Self-pay | Admitting: *Deleted

## 2017-04-15 ENCOUNTER — Ambulatory Visit (INDEPENDENT_AMBULATORY_CARE_PROVIDER_SITE_OTHER): Payer: Self-pay | Admitting: Women's Health

## 2017-04-15 ENCOUNTER — Encounter: Payer: Self-pay | Admitting: Women's Health

## 2017-04-15 VITALS — BP 110/80 | Ht 61.0 in | Wt 162.0 lb

## 2017-04-15 DIAGNOSIS — Z01419 Encounter for gynecological examination (general) (routine) without abnormal findings: Secondary | ICD-10-CM

## 2017-04-15 DIAGNOSIS — B9689 Other specified bacterial agents as the cause of diseases classified elsewhere: Secondary | ICD-10-CM

## 2017-04-15 DIAGNOSIS — N760 Acute vaginitis: Secondary | ICD-10-CM

## 2017-04-15 DIAGNOSIS — B373 Candidiasis of vulva and vagina: Secondary | ICD-10-CM

## 2017-04-15 DIAGNOSIS — B3731 Acute candidiasis of vulva and vagina: Secondary | ICD-10-CM

## 2017-04-15 LAB — CBC WITH DIFFERENTIAL/PLATELET
Basophils Absolute: 52 cells/uL (ref 0–200)
Basophils Relative: 0.7 %
EOS ABS: 163 {cells}/uL (ref 15–500)
Eosinophils Relative: 2.2 %
HCT: 39 % (ref 35.0–45.0)
HEMOGLOBIN: 13.3 g/dL (ref 11.7–15.5)
Lymphs Abs: 2272 cells/uL (ref 850–3900)
MCH: 29 pg (ref 27.0–33.0)
MCHC: 34.1 g/dL (ref 32.0–36.0)
MCV: 85.2 fL (ref 80.0–100.0)
MPV: 11.7 fL (ref 7.5–12.5)
Monocytes Relative: 7.3 %
NEUTROS ABS: 4373 {cells}/uL (ref 1500–7800)
Neutrophils Relative %: 59.1 %
Platelets: 212 10*3/uL (ref 140–400)
RBC: 4.58 10*6/uL (ref 3.80–5.10)
RDW: 13 % (ref 11.0–15.0)
TOTAL LYMPHOCYTE: 30.7 %
WBC: 7.4 10*3/uL (ref 3.8–10.8)
WBCMIX: 540 {cells}/uL (ref 200–950)

## 2017-04-15 LAB — GLUCOSE, RANDOM: Glucose, Bld: 87 mg/dL (ref 65–99)

## 2017-04-15 MED ORDER — FLUCONAZOLE 150 MG PO TABS: 150.0000 mg | ORAL_TABLET | Freq: Once | ORAL | 1 refills | Status: AC

## 2017-04-15 MED ORDER — METRONIDAZOLE 500 MG PO TABS
500.0000 mg | ORAL_TABLET | Freq: Two times a day (BID) | ORAL | 0 refills | Status: DC
Start: 2017-04-15 — End: 2019-08-16

## 2017-04-15 NOTE — Patient Instructions (Addendum)
[place] (Health Maintenance, Female) Un estilo de vida saludable y los cuidados preventivos pueden favorecer considerablemente a la salud y Musician. Pregunte a su mdico cul es el cronograma de exmenes peridicos apropiado para usted. Esta es una buena oportunidad para consultarlo sobre cmo prevenir enfermedades y [place] sano. Adems de los controles, hay muchas otras cosas que puede hacer usted mismo. Los expertos han realizado numerosas investigaciones [employer] cambios en el estilo de vida y las medidas de prevencin que, [place], lo ayudarn a mantenerse sano. Solicite a su mdico ms informacin. EL PESO Y LA DIETA Consuma una dieta saludable.  Asegrese de [employer] verduras, frutas, productos lcteos de bajo contenido de [place] y Advertising account planner.  No consuma muchos alimentos de alto contenido de grasas slidas, azcares agregados o sal.  Realice actividad fsica con regularidad. Esta es una de las prcticas ms importantes que puede hacer por su salud. ? La Delorise Shiner de los adultos deben hacer ejercicio durante al menos 124mnutos por semana. El ejercicio debe aumentar la frecuencia cardaca y pActorla transpiracin (ejercicio de [place]. ? La mayora de los adultos tambin deben hacer ejercicios de elongacin al mToysRusveces a la semana. Agregue esto al su plan de ejercicio de intensidad moderada. Mantenga un peso saludable.  El ndice de masa corporal ([facility] es una medida que puede utilizarse para identificar posibles problemas de pEast [place]town Proporciona una estimacin de la grasa corporal basndose en el peso y la altura. Su mdico puede ayudarle a dRadiation protection practitionerISouth Endy a lScientist, forensico mTheatre managerun peso saludable.  Para las mujeres de 20aos o ms: ? Un IJohn R. Oishei Children'S Hospitalmenor de 18,5 se considera bajo peso. ? Un ICumberland County Hospitalentre 18,5 y 24,9 es normal. ? Un IPelham Medical Centerentre 25 y 29,9 se considera sobrepeso. ? Un IMC de 30 o ms se considera  obesidad. Observe los niveles de colesterol y lpidos en la sangre.  Debe comenzar a rEnglish as a second language teacherde lpidos y cResearch officer, trade unionen la sangre a los 20aos y luego repetirlos cada 516aos  Es posible que nAutomotive engineerlos niveles de colesterol con mayor frecuencia si: ? Sus niveles de lpidos y colesterol son altos. ? Es mayor de 527CWC ? Presenta un alto riesgo de padecer enfermedades cardacas. DETECCIN DE CNCER Cncer de pulmn  Se recomienda realizar exmenes de deteccin de cncer de pulmn a personas adultas entre 574y 892aos que estn en riesgo de dHorticulturist, commercialde pulmn por sus antecedentes de consumo de tabaco.  Se recomienda una tomografa computarizada de baja dosis de los pulmones todos los aos a las personas que: ? Fuman actualmente. ? Hayan dejado el hbito en algn momento en los ltimos 15aos. ? Hayan fumado durante 30aos un paquete diario. Un paquete-ao equivale a fumar un promedio de un paquete de cigarrillos diario durante un ao.  Los exmenes de deteccin anuales deben continuar hasta que hayan pasado 15aos desde que dej de fumar.  Ya no debern realizarse si tiene un problema de salud que le impida recibir tratamiento para eScience writerde pulmn. Cncer de mama  Practique la autoconciencia de la mama. Esto significa reconocer la apariencia normal de sus mamas y cmo las siente.  Tambin significa realizar autoexmenes regulares de lJohnson & Johnson Informe a su mdico sobre cualquier cambio, sin importar cun pequeo sea.  Si tiene entre 20 y 363aos, un mdico debe realizarle un examen clnico de las mamas como parte del examen regular de s[place] cada 1 a  3aos.  Si tiene 40aos o ms, debe realizarse un examen clnico de las mamas todos los aos. Tambin considere realizarse una radiografa de las mamas (mamografa) todos los aos.  Si tiene antecedentes familiares de cncer de mama, hable con su mdico para someterse a un estudio gentico.  Si  tiene alto riesgo de padecer cncer de mama, hable con su mdico para someterse a una resonancia magntica y una mamografa todos los aos.  La evaluacin del gen del cncer de mama (BRCA) se recomienda a mujeres que tengan familiares con cnceres relacionados con el BRCA. Los cnceres relacionados con el BRCA incluyen los siguientes: ? Mama. ? Ovario. ? Trompas. ? Cnceres de peritoneo.  Los resultados de la evaluacin determinarn la necesidad de asesoramiento gentico y de anlisis de BRCA1 y BRCA2. Cncer de cuello del tero El mdico puede recomendarle que se haga pruebas peridicas de deteccin de cncer de los rganos de la pelvis (ovarios, tero y vagina). Estas pruebas incluyen un examen plvico, que abarca controlar si se produjeron cambios microscpicos en la superficie del cuello del tero (prueba de Papanicolaou). Pueden recomendarle que se haga estas pruebas cada 3aos, a partir de los 21aos.  A las mujeres que tienen entre 30 y 65aos, los mdicos pueden recomendarles que se sometan a exmenes plvicos y pruebas de Papanicolaou cada 3aos, o a la prueba de Papanicolaou y el examen plvico en combinacin con estudios de deteccin del virus del papiloma humano (VPH) cada 5aos. Algunos tipos de VPH aumentan el riesgo de padecer cncer de cuello del tero. La prueba para la deteccin del VPH tambin puede realizarse a mujeres de cualquier edad cuyos resultados de la prueba de Papanicolaou no sean claros.  Es posible que otros mdicos no recomienden exmenes de deteccin a mujeres no embarazadas que se consideran sujetos de bajo riesgo de padecer cncer de pelvis y que no tienen sntomas. Pregntele al mdico si un examen plvico de deteccin es adecuado para usted.  Si ha recibido un tratamiento para el cncer cervical o una enfermedad que podra causar cncer, necesitar realizarse una prueba de Papanicolaou y controles durante al menos 20 aos de concluido el tratamiento. Si no se  ha hecho el Papanicolaou con regularidad, debern volver a evaluarse los factores de riesgo (como tener un nuevo compaero sexual), para determinar si debe realizarse los estudios nuevamente. Algunas mujeres sufren problemas mdicos que aumentan la probabilidad de contraer cncer de cuello del tero. En estos casos, el mdico podr indicar que se realicen controles y pruebas de Papanicolaou con ms frecuencia. Cncer colorrectal  Este tipo de cncer puede detectarse y a menudo prevenirse.  Por lo general, los estudios de rutina se deben comenzar a hacer a partir de los 50 aos y hasta los 75 aos.  Sin embargo, el mdico podr aconsejarle que lo haga antes, si tiene factores de riesgo para el cncer de colon.  Tambin puede recomendarle que use un kit de prueba para hallar sangre oculta en la materia fecal.  Es posible que se use una pequea cmara en el extremo de un tubo para examinar directamente el colon (sigmoidoscopia o colonoscopia) a fin de detectar formas tempranas de cncer colorrectal.  Los exmenes de rutina generalmente comienzan a los 50aos.  El examen directo del colon se debe repetir cada 5 a 10aos hasta los 75aos. Sin embargo, es posible que se realicen exmenes con mayor frecuencia, si se detectan formas tempranas de plipos precancerosos o pequeos bultos. Cncer de piel  Revise la piel   de la cabeza a los pies con regularidad.  Informe a su mdico si aparecen nuevos lunares o los que tiene se modifican, especialmente en su forma y color.  Tambin notifique al mdico si tiene un lunar que es ms grande que el tamao de una goma de lpiz.  Siempre use pantalla solar. Aplique pantalla solar de manera libre y repetida a lo largo del da.  Protjase usando mangas y pantalones largos, un sombrero de ala ancha y gafas para el sol, siempre que se encuentre en el exterior. ENFERMEDADES CARDACAS, DIABETES E HIPERTENSIN ARTERIAL  La hipertensin arterial causa  enfermedades cardacas y aumenta el riesgo de ictus. La hipertensin arterial es ms probable en los siguientes casos: ? Las personas que tienen la presin arterial en el extremo del rango normal (100-139/85-89 mm Hg). ? Las personas con sobrepeso u obesidad. ? Las personas afroamericanas.  Si usted tiene entre 18 y 39 aos, debe medirse la presin arterial cada 3 a 5 aos. Si usted tiene 40 aos o ms, debe medirse la presin arterial todos los aos. Debe medirse la presin arterial dos veces: una vez cuando est en un hospital o una clnica y la otra vez cuando est en otro sitio. Registre el promedio de las dos mediciones. Para controlar su presin arterial cuando no est en un hospital o una clnica, puede usar lo siguiente: ? Una mquina automtica para medir la presin arterial en una farmacia. ? Un monitor para medir la presin arterial en el hogar.  Si tiene entre 55 y 79 aos, consulte a su mdico si debe tomar aspirina para prevenir el ictus.  Realcese exmenes de deteccin de la diabetes con regularidad. Esto incluye la toma de una muestra de sangre para controlar el nivel de azcar en la sangre durante el ayuno. ? Si tiene un peso normal y un bajo riesgo de padecer diabetes, realcese este anlisis cada tres aos despus de los 45aos. ? Si tiene sobrepeso y un alto riesgo de padecer diabetes, considere someterse a este anlisis antes o con mayor frecuencia. PREVENCIN DE INFECCIONES HepatitisB  Si tiene un riesgo ms alto de contraer hepatitis B, debe someterse a un examen de deteccin de este virus. Se considera que tiene un alto riesgo de contraer hepatitis B si: ? Naci en un pas donde la hepatitis B es frecuente. Pregntele a su mdico qu pases son considerados de alto riesgo. ? Sus padres nacieron en un pas de alto riesgo y usted no recibi una vacuna que lo proteja contra la hepatitis B (vacuna contra la hepatitis B). ? Tiene VIH o sida. ? Usa agujas para inyectarse  drogas. ? Vive con alguien que tiene hepatitis B. ? Ha tenido sexo con alguien que tiene hepatitis B. ? Recibe tratamiento de hemodilisis. ? Toma ciertos medicamentos para el cncer, trasplante de rganos y afecciones autoinmunitarias. Hepatitis C  Se recomienda un anlisis de sangre para: ? Todos los que nacieron entre 1945 y 1965. ? Todas las personas que tengan un riesgo de haber contrado hepatitis C. Enfermedades de transmisin sexual (ETS).  Debe realizarse pruebas de deteccin de enfermedades de transmisin sexual (ETS), incluidas gonorrea y clamidia si: ? Es sexualmente activo y es menor de 24aos. ? Es mayor de 24aos, y el mdico le informa que corre riesgo de tener este tipo de infecciones. ? La actividad sexual ha cambiado desde que le hicieron la ltima prueba de deteccin y tiene un riesgo mayor de tener clamidia o gonorrea. Pregntele al mdico si usted   tiene riesgo.  Si no tiene el VIH, pero corre riesgo de infectarse por el virus, se recomienda tomar diariamente un medicamento recetado para evitar la infeccin. Esto se conoce como profilaxis previa a la exposicin. Se considera que est en riesgo si: ? Es [place] sexualmente y no [place] preservativos habitualmente o no conoce el estado del VIH de sus Advertising copywriter. ? Se inyecta drogas. ? Es [place] sexualmente con Ardelia Mems pareja que tiene VIH. Consulte a su mdico para saber si tiene un alto riesgo de infectarse por el VIH. Si opta por comenzar la profilaxis previa a la exposicin, primero debe realizarse anlisis de deteccin del VIH. Luego, le harn anlisis cada 63mses mientras est tomando los medicamentos para la profilaxis previa a la exposicin. [facility] Si es premenopusica y puede quedar eSharptown solicite a su mdico asesoramiento previo a la concepcin.  Si puede quedar embarazada, tome 400 a [id number](mcg) de cido fAnheuser-Busch  Si desea evitar el embarazo, hable con su mdico sobre el  control de la natalidad (anticoncepcin). OSTEOPOROSIS Y MENOPAUSIA  La osteoporosis es una enfermedad en la que los huesos pierden los minerales y la fuerza por el avance de la edad. El resultado pueden ser fracturas graves en los hDavis Junction El riesgo de osteoporosis puede identificarse con uArdelia Memsprueba de densidad sea.  Si tiene 65aos o ms, o si est en riesgo de sufrir osteoporosis y fracturas, pregunte a su mdico si debe someterse a exmenes.  Consulte a su mdico si debe tomar un suplemento de calcio o de vitamina D para reducir el riesgo de osteoporosis.  La menopausia puede presentar ciertos sntomas fsicos y rGaffer  La terapia de reemplazo hormonal puede reducir algunos de estos sntomas y rGaffer Consulte a su mdico para saber si la terapia de reemplazo hormonal es conveniente para usted. INSTRUCCIONES PARA EL CUIDADO EN EL HOGAR  Realcese los estudios de rutina de la salud, dentales y de lPublic librarian  MRienzi  No consuma ningn producto que contenga tabaco, lo que incluye cigarrillos, tabaco de mHigher education careers advisero cPsychologist, sport and exercise  Si est embarazada, no beba alcohol.  Si est amamantando, reduzca el consumo de alcohol y la frecuencia con la que consume.  Si es mujer y no est embarazada limite el consumo de alcohol a no ms de 1 medida por da. Una medida equivale a 12onzas de cerveza, 5onzas de vino o 1onzas de bebidas alcohlicas de alta graduacin.  No consuma drogas.  No comparta agujas.  Solicite ayuda a su mdico si necesita apoyo o informacin para abandonar las drogas.  Informe a su mdico si a menudo se siente deprimido.  Notifique a su mdico si alguna vez ha sido vctima de abuso o si no se siente seguro en su hogar. Esta informacin no tiene cMarine scientistel consejo del mdico. Asegrese de hacerle al mdico cualquier pregunta que tenga. Document Released: 06/20/2011 Document Revised: 07/22/2014 Document Reviewed:  04/04/2015 Elsevier Interactive Patient Education  2018 [employer] Candidiasis vaginal en los adultos (Gastrointestinal Yeast Infection, Adult) La candidiasis vaginal es una afeccin que causa dolor, hinchazn y enrojecimiento (inflamacin) de la vagina. Tambin causa secrecin vaginal. Esta es una enfermedad frecuente. Algunas mujeres contraen esta infeccin con frecuencia. CAUSAS La causa de la infeccin es un cambio en el equilibrio normal de los hongos (cndida) y las bacterias que viven en la vagina. Esta alteracin deriva en el crecimiento excesivo de los hongos, lo que causa la inflamacin. FACTORES DE  RIESGO Es ms probable que esta afeccin se manifieste en:  Las mujeres que toman antibiticos.  Las mujeres que tienen diabetes.  Las mujeres que toman anticonceptivos.  Las mujeres que estn embarazadas.  Las mujeres que se hacen duchas vaginales con frecuencia.  Las mujeres que tienen un sistema de defensa (inmunitario) dbil.  Las mujeres que han tomado corticoides durante mucho tiempo.  Las mujeres que usan ropa ajustada con frecuencia. SNTOMAS Los sntomas de esta afeccin incluyen lo siguiente:  Secrecin vaginal blanca y espesa.  Hinchazn, picazn, enrojecimiento e irritacin de la vagina. Los labios de la vagina (vulva) tambin se pueden infectar.  Dolor o ardor al Garment/textile technologist.  [place]. DIAGNSTICO Esta afeccin se diagnostica mediante la historia clnica y un examen fsico. Este incluye un examen plvico. El mdico examinar una muestra de la secrecin vaginal con un microscopio. Probablemente el mdico enve esta muestra al laboratorio para analizarla y confirmar el diagnstico. TRATAMIENTO Esta afeccin se trata con medicamentos. Los [place] pueden ser recetados o de venta libre. Podrn indicarle que use uno o ms de lo siguiente:  Medicamentos por va oral.  Medicamentos que se aplican como una crema.  Medicamentos que  se colocan directamente en la vagina (vulos vaginales). [place] o aplquese los medicamentos de venta libre y Editor, commissioning como se lo haya indicado el mdico.  No tenga relaciones sexuales hasta que el mdico lo autorice. Comunique a su compaero sexual que tiene una infeccin por hongos. Esas personas deben consultar al mdico si tienen sntomas.  No use ropa ajustada, como pantis o pantalones ajustados.  Evite el uso de tampones hasta que el mdico lo autorice.  Consuma ms yogur. Esto puede ayudar a Technical brewer de la candidiasis.  Intente darse un bao de asiento para [employer]. Se trata de un bao de agua tibia que se toma mientras se est sentado. El agua solo debe Systems analyst las caderas y cubrir las nalgas. Hgalo 3o 4veces al da o como se lo haya indicado el mdico.  No se haga duchas vaginales.  Use ropa interior transpirable de algodn.  Si tiene diabetes, mantenga bajo control los niveles de Dispensing optician. SOLICITE ATENCIN MDICA SI:  Jaclynn Guarneri.  Los sntomas desaparecen y Teacher, adult education.  Los sntomas no mejoran con Dispensing optician.  Los sntomas empeoran.  Aparecen nuevos sntomas.  Aparecen ampollas alrededor o adentro de la vagina.  Le sale sangre de la vagina y no est menstruando.  Siente dolor en el abdomen. Esta informacin no tiene Marine scientist el consejo del mdico. Asegrese de hacerle al mdico cualquier pregunta que tenga. Document Released: 04/10/2005 Document Revised: 10/23/2015 Document Reviewed: 01/02/2015 Elsevier Interactive Patient Education  2018 [place] bacteriana (Bacterial Vaginosis) La vaginosis bacteriana es una infeccin de la vagina. Se produce cuando crece una cantidad excesiva de grmenes normales (bacterias sanas) en la vagina. Esta infeccin aumenta el riesgo de contraer otras infecciones de transmisin sexual. El  tratamiento de esta infeccin puede ayudar a reducir el riesgo de otras infecciones, como:  Clamidia.  Roderick Pee.  VIH.  Herpes. [place] los medicamentos tal como se lo indic su mdico.  Finalice la prescripcin completa, aunque comience a sentirse mejor.  Comunique a sus compaeros sexuales que sufre una infeccin. Deben consultar a su mdico para iniciar un tratamiento.  Durante el tratamiento: ? Teacher, music o use preservativos  de la forma correcta. ? No se haga duchas vaginales. ? No consuma alcohol a menos que el mdico lo autorice. ? No amamante a menos que el mdico la autorice.  SOLICITE AYUDA SI:  No mejora luego de 3 das de tratamiento.  Observa una secrecin (prdida) de color gris ms abundante que proviene de la vagina.  Siente ms dolor que antes.  Tiene fiebre.  ASEGRESE DE QUE:  Comprende estas instrucciones.  Controlar su afeccin.  Recibir ayuda de inmediato si no mejora o si empeora.  Esta informacin no tiene Marine scientist el consejo del mdico. Asegrese de hacerle al mdico cualquier pregunta que tenga. Document Released: 09/27/2008 Document Revised: 10/23/2015 Document Reviewed: 02/10/2013 Elsevier Interactive Patient Education  2017 [employer].

## 2017-04-15 NOTE — Progress Notes (Signed)
Emily [name] 11-28-1985 [id number]    History:    Presents for annual exam.  New patient. Monthly 6 day cycle using rhythm. Reports normal Pap history. Speaks Spanish, interpreter present for entire exam. Denies health problems, normal pregnancies.  Past medical history, past surgical history, family history and social history were all reviewed and documented in the EPIC chart. Homemaker. Father diabetes pills only.2 children ages 30 months and 67 years old both doing well.   ROS:  A ROS was performed and pertinent positives and negatives are included.  Exam:  Vitals:   04/15/17 1415  BP: 110/80  Weight: 162 lb (73.5 kg)  Height:  (1.549 m)   Body mass index is 30.61 kg/m.   General appearance:  Normal Thyroid:  Symmetrical, normal in size, without palpable masses or nodularity. Respiratory  Auscultation:  Clear without wheezing or rhonchi Cardiovascular  Auscultation:  Regular rate, without rubs, murmurs or gallops  Edema/varicosities:  Not grossly evident Abdominal  Soft,nontender, without masses, guarding or rebound.  Liver/spleen:  No organomegaly noted  Hernia:  None appreciated  Skin  Inspection:  Grossly normal   Breasts: Examined lying and sitting.     Right: Without masses, retractions, discharge or axillary adenopathy.     Left: Without masses, retractions, discharge or axillary adenopathy. Gentitourinary   Inguinal/mons:  Normal without inguinal adenopathy  External genitalia:  Normal  BUS/Urethra/Skene's glands:  Normal  Vagina: White adherent discharge , wet prep present for yeast and clues, TNTC bacteria  Cervix:  Normal  Uterus:   normal in size, shape and contour.  Midline and mobile  Adnexa/parametria:     Rt: Without masses or tenderness.   Lt: Without masses or tenderness.  Anus and perineum: Normal  Digital rectal exam: Normal sphincter tone without palpated masses or tenderness  Assessment/Plan:  31 y.o. MHF G2 P2  for annual exam  with no complaints.  Monthly 6 day cycle/rhythm Contraception management Bacteria vaginosis  Plan: Contraception options reviewed would like Nexplanon, reviewed procedure for placement and upper inner arm, placed with cycle, possible irregular bleeding initially reviewed. SBE's, exercise, calcium rich diet, MVI daily encouraged. Reviewed importance of increasing exercise and decreasing calories/carbs for weight loss. Flagyl 500 twice daily for 7 days, alcohol precautions reviewed, Diflucan 150 by mouth 1 dose, prescription, proper use given and reviewed. Instructed to call if no relief of discharge. CBC, glucose, Pap.    Harrington Challenger [facility], 6:47 PM 04/15/2017

## 2017-04-16 LAB — PAP IG W/ RFLX HPV ASCU

## 2018-05-08 ENCOUNTER — Encounter (HOSPITAL_COMMUNITY): Payer: Self-pay | Admitting: Emergency Medicine

## 2018-05-08 ENCOUNTER — Ambulatory Visit (HOSPITAL_COMMUNITY)
Admission: EM | Admit: 2018-05-08 | Discharge: 2018-05-08 | Disposition: A | Discharge status: Home / Self Care | Payer: Self-pay | Attending: Family Medicine | Admitting: Family Medicine

## 2018-05-08 DIAGNOSIS — L0100 Impetigo, unspecified: Secondary | ICD-10-CM

## 2018-05-08 MED ORDER — MUPIROCIN 2 % EX OINT: 1.0000 "application " | TOPICAL_OINTMENT | Freq: Two times a day (BID) | CUTANEOUS | 0 refills | Status: DC

## 2018-05-08 MED ORDER — BACITRACIN ZINC 500 UNIT/GM EX OINT
TOPICAL_OINTMENT | CUTANEOUS | Status: AC
  Filled 2018-05-08: qty 28.35

## 2018-05-08 MED ORDER — CEPHALEXIN 500 MG PO CAPS: 500.0000 mg | ORAL_CAPSULE | Freq: Four times a day (QID) | ORAL | 0 refills | Status: AC

## 2018-05-08 NOTE — ED Provider Notes (Signed)
[facility]    CSN: [id number] Arrival date & time: 05/08/18  1233     History   Chief Complaint Chief Complaint  Patient presents with  . Insect Bite    HPI Emily [name] is a 32 y.o. female.   32 year old female comes in for bilateral lower extremity blister for the past week.  States left lower extremity blister has opened and drained.  Now swollen, surrounding erythema, pain.  States also has a blister on the right lower extremity that is slowly increasing in size.  Denies fever, chills, night sweats.  States last shaved 1 week ago shortly prior to current symptom onset.  No other injury/trauma noted.  Possible insect bite as it started out pruritic.      Past Medical History:  Diagnosis Date  . Medical history non-contributory     Patient Active Problem List   Diagnosis Date Noted  . Normal labor and delivery 10/19/2015  . NVD (normal vaginal delivery) 08/30/2013  . Pregnancy 08/29/2013    Past Surgical History:  Procedure Laterality Date  . NO PAST SURGERIES      OB History    Gravida  2   Para  2   Term  2   Preterm      AB  0   Living  2     SAB      TAB      Ectopic  0   Multiple  0   Live Births  2            Home Medications    Prior to Admission medications   Medication Sig Start Date End Date Taking? Authorizing Provider  cephALEXin (KEFLEX) 500 MG capsule Take 1 capsule (500 mg total) by mouth 4 (four) times daily for 7 days. 05/08/18 05/15/18  Cathie Hoops, Kamaria Lucia V, PA-C  metroNIDAZOLE (FLAGYL) 500 MG tablet Take 1 tablet (500 mg total) by mouth 2 (two) times daily. 04/15/17   Harrington Challenger, NP  Multiple Vitamin (MULTIVITAMIN) capsule Take 1 capsule by mouth daily.    [provider]  mupirocin ointment (BACTROBAN) 2 % Apply 1 application topically 2 (two) times daily. 05/08/18   Belinda Fisher, PA-C    Family History Family History  Problem Relation Age of Onset  . Asthma Neg Hx   . Arthritis Neg Hx   .  Alcohol abuse Neg Hx   . Birth defects Neg Hx   . COPD Neg Hx   . Cancer Neg Hx   . Depression Neg Hx   . Diabetes Neg Hx   . Drug abuse Neg Hx   . Early death Neg Hx   . Hearing loss Neg Hx   . Heart disease Neg Hx   . Hypertension Neg Hx   . Hyperlipidemia Neg Hx   . Kidney disease Neg Hx   . Learning disabilities Neg Hx   . Mental illness Neg Hx   . Mental retardation Neg Hx   . Miscarriages / Stillbirths Neg Hx   . Stroke Neg Hx   . Vision loss Neg Hx   . Varicose Veins Neg Hx     Social History Social History   Tobacco Use  . Smoking status: Never Smoker  . Smokeless tobacco: Never Used  Substance Use Topics  . Alcohol use: Yes  . Drug use: No     Allergies   Patient has no known allergies.   Review of Systems Review of Systems  Reason unable to  perform ROS: See HPI as above.     Physical Exam Triage Vital Signs ED Triage Vitals  Enc Vitals Group     BP 05/08/18 1256 125/75     Pulse Rate 05/08/18 1256 66     Resp 05/08/18 1256 20     Temp 05/08/18 1256 97.7 F (36.5 C)     Temp Source 05/08/18 1256 Oral     SpO2 05/08/18 1256 97 %     Weight --      Height --      Head Circumference --      Peak Flow --      Pain Score 05/08/18 1259 5     Pain Loc --      Pain Edu? --      Excl. in GC? --    No data found.  Updated Vital Signs BP 125/75 (BP Location: Left Arm)   Pulse 66   Temp 97.7 F (36.5 C) (Oral)   Resp 20   LMP 05/01/2018   SpO2 97%   Breastfeeding? No   Physical Exam  Constitutional: She is oriented to person, place, and time. She appears well-developed and well-nourished. No distress.  HENT:  Head: Normocephalic and atraumatic.  Eyes: Pupils are equal, round, and reactive to light. Conjunctivae are normal.  Neurological: She is alert and oriented to person, place, and time.  Skin: Skin is warm and dry. She is not diaphoretic.  See picture below. No warmth. Mild tenderness to palpation.    Left medial lower  leg:    Right medial lower leg:     UC Treatments / Results  Labs (all labs ordered are listed, but only abnormal results are displayed) Labs Reviewed - No data to display  EKG None  Radiology No results found.  Procedures Procedures (including critical care time)  Medications Ordered in UC Medications - No data to display  Initial Impression / Assessment and Plan / UC Course  I have reviewed the triage vital signs and the nursing notes.  Pertinent labs & imaging results that were available during my care of the patient were reviewed by me and considered in my medical decision making (see chart for details).    Will cover for impetigo, possibly from shaving. Start keflex as directed. bactroban as directed. Wound care instructions given. Return precautions given.  Final Clinical Impressions(s) / UC Diagnoses   Final diagnoses:  Impetigo   ED Prescriptions    Medication Sig Dispense Auth. Provider   cephALEXin (KEFLEX) 500 MG capsule Take 1 capsule (500 mg total) by mouth 4 (four) times daily for 7 days. 28 capsule Angie Piercey V, PA-C   mupirocin ointment (BACTROBAN) 2 % Apply 1 application topically 2 (two) times daily. 22 g Threasa Alpha, [place] 05/08/18 1329

## 2018-05-08 NOTE — ED Triage Notes (Signed)
Here for blister on BLE onset 1 week  Reports blister on LLE has popped and drained but is swollen and painful w/redness around  RLE has also popped and is draining clear fluid  Denies fevers, chills, trauma  A&O x4... NAD... Ambulatory

## 2018-05-08 NOTE — Discharge Instructions (Signed)
Start keflex as directed. Bactroban ointment to dress wound. Please do daily dressings. You can clean area with soap and water. Do not soak in water. Refrain from shaving for now, and please switch out your razor. Monitor for spreading redness, increased warmth, fever, wound not healing, follow up for reevaluation needed.

## 2018-08-26 ENCOUNTER — Ambulatory Visit (HOSPITAL_COMMUNITY)
Admission: EM | Admit: 2018-08-26 | Discharge: 2018-08-26 | Disposition: A | Discharge status: Home / Self Care | Payer: Self-pay | Attending: Internal Medicine | Admitting: Internal Medicine

## 2018-08-26 ENCOUNTER — Encounter (HOSPITAL_COMMUNITY): Payer: Self-pay | Admitting: Emergency Medicine

## 2018-08-26 DIAGNOSIS — M7918 Myalgia, other site: Secondary | ICD-10-CM

## 2018-08-26 DIAGNOSIS — H9202 Otalgia, left ear: Secondary | ICD-10-CM

## 2018-08-26 DIAGNOSIS — R6889 Other general symptoms and signs: Secondary | ICD-10-CM

## 2018-08-26 DIAGNOSIS — R05 Cough: Secondary | ICD-10-CM

## 2018-08-26 DIAGNOSIS — R0981 Nasal congestion: Secondary | ICD-10-CM

## 2018-08-26 DIAGNOSIS — J029 Acute pharyngitis, unspecified: Secondary | ICD-10-CM

## 2018-08-26 DIAGNOSIS — H6122 Impacted cerumen, left ear: Secondary | ICD-10-CM

## 2018-08-26 DIAGNOSIS — R51 Headache: Secondary | ICD-10-CM

## 2018-08-26 LAB — POCT RAPID STREP A: STREPTOCOCCUS, GROUP A SCREEN (DIRECT): NEGATIVE

## 2018-08-26 MED ORDER — OSELTAMIVIR PHOSPHATE 75 MG PO CAPS: 75.0000 mg | ORAL_CAPSULE | Freq: Two times a day (BID) | ORAL | 0 refills | Status: DC

## 2018-08-26 MED ORDER — AZITHROMYCIN 250 MG PO TABS: ORAL_TABLET | ORAL | 1 refills | Status: DC

## 2018-08-26 MED ORDER — BENZONATATE 100 MG PO CAPS: ORAL_CAPSULE | ORAL | 0 refills | Status: DC

## 2018-08-26 NOTE — ED Provider Notes (Addendum)
[facility]    CSN: [id number] Arrival date & time: 08/26/18  1821     History   Chief Complaint Chief Complaint  Patient presents with  . Sore Throat  . Otalgia    HPI Emily [name] is a 33 y.o. female.   Onset of ST x 4 days and got worse. OTC med has not helped. Today started with bilatearl ear pain. She has also been having nose congestion and posterior drainage and cough. She got worse yesterday with HA and body aches and was in bed most of the day. She could not even stand her kids to hug her because he body hurt her so much.      Past Medical History:  Diagnosis Date  . Medical history non-contributory     Patient Active Problem List   Diagnosis Date Noted  . Normal labor and delivery 10/19/2015  . NVD (normal vaginal delivery) 08/30/2013  . Pregnancy 08/29/2013    Past Surgical History:  Procedure Laterality Date  . NO PAST SURGERIES      OB History    Gravida  2   Para  2   Term  2   Preterm      AB  0   Living  2     SAB      TAB      Ectopic  0   Multiple  0   Live Births  2            Home Medications    Prior to Admission medications   Medication Sig Start Date End Date Taking? Authorizing Provider  metroNIDAZOLE (FLAGYL) 500 MG tablet Take 1 tablet (500 mg total) by mouth 2 (two) times daily. Patient not taking: Reported on 08/26/2018 04/15/17   Harrington ChallengerYoung, Nancy J, NP  Multiple Vitamin (MULTIVITAMIN) capsule Take 1 capsule by mouth daily.    [provider]  mupirocin ointment (BACTROBAN) 2 % Apply 1 application topically 2 (two) times daily. 05/08/18   Belinda FisherYu, Amy V, PA-C    Family History Family History  Problem Relation Age of Onset  . Asthma Neg Hx   . Arthritis Neg Hx   . Alcohol abuse Neg Hx   . Birth defects Neg Hx   . COPD Neg Hx   . Cancer Neg Hx   . Depression Neg Hx   . Diabetes Neg Hx   . Drug abuse Neg Hx   . Early death Neg Hx   . Hearing loss Neg Hx   . Heart disease Neg Hx     . Hypertension Neg Hx   . Hyperlipidemia Neg Hx   . Kidney disease Neg Hx   . Learning disabilities Neg Hx   . Mental illness Neg Hx   . Mental retardation Neg Hx   . Miscarriages / Stillbirths Neg Hx   . Stroke Neg Hx   . Vision loss Neg Hx   . Varicose Veins Neg Hx     Social History Social History   Tobacco Use  . Smoking status: Never Smoker  . Smokeless tobacco: Never Used  Substance Use Topics  . Alcohol use: Yes  . Drug use: No     Allergies   Patient has no known allergies.  Review of Systems Review of Systems  Constitutional: Positive for chills, fatigue and fever. Negative for appetite change and diaphoresis.  HENT: Positive for congestion, dental problem, ear pain, postnasal drip, rhinorrhea, sore throat and trouble swallowing. Negative for ear discharge, facial  swelling, sinus pressure, sinus pain and voice change.   Eyes: Negative for discharge.  Respiratory: Positive for cough. Negative for chest tightness, shortness of breath and wheezing.   Cardiovascular: Negative for chest pain.  Gastrointestinal: Negative for abdominal pain, diarrhea, nausea and vomiting.  Musculoskeletal: Positive for gait problem and myalgias. Negative for neck pain and neck stiffness.  Skin: Negative for rash and wound.  Neurological: Positive for headaches. Negative for dizziness.   Physical Exam Triage Vital Signs ED Triage Vitals  Enc Vitals Group     BP 08/26/18 1853 122/73     Pulse Rate 08/26/18 1853 72     Resp 08/26/18 1853 18     Temp 08/26/18 1853 98.3 F (36.8 C)     Temp Source 08/26/18 1853 Oral     SpO2 08/26/18 1853 99 %     Weight --      Height --      Head Circumference --      Peak Flow --      Pain Score 08/26/18 1855 8     Pain Loc --      Pain Edu? --      Excl. in GC? --    No data found.  Updated Vital Signs BP 122/73 (BP Location: Left Arm)   Pulse 72   Temp 98.3 F (36.8 C) (Oral)   Resp 18   LMP 08/23/2018   SpO2 99%   Visual  Acuity Right Eye Distance:   Left Eye Distance:   Bilateral Distance:    Right Eye Near:   Left Eye Near:    Bilateral Near:     Physical Exam Vitals signs and nursing note reviewed.  Constitutional:      General: She is not in acute distress.    Appearance: She is ill-appearing. She is not toxic-appearing.     Comments: He cough sounds very mucosy.   HENT:     Head: Normocephalic.     Right Ear: Tympanic membrane and ear canal normal. No drainage, swelling or tenderness. Tympanic membrane is not erythematous.     Left Ear: No drainage, swelling or tenderness.     Ears:     Comments: Has large wax buildup on the L and could not see the TM since it is impacted. The  wax was not able to be removed after lavage.     Mouth/Throat:     Mouth: Mucous membranes are moist. No oral lesions.     Pharynx: Uvula midline. Posterior oropharyngeal erythema present. No pharyngeal swelling, oropharyngeal exudate or uvula swelling.     Tonsils: No tonsillar exudate or tonsillar abscesses. Swelling: 1+ on the right. 1+ on the left.  Eyes:     Conjunctiva/sclera: Conjunctivae normal.  Neck:     Musculoskeletal: Neck supple.  Cardiovascular:     Rate and Rhythm: Normal rate and regular rhythm.     Heart sounds: No murmur.  Pulmonary:     Effort: Pulmonary effort is normal.     Breath sounds: Normal breath sounds. No wheezing, rhonchi or rales.  Skin:    General: Skin is warm and dry.  Neurological:     General: No focal deficit present.     Mental Status: She is alert and oriented to person, place, and time.  Psychiatric:        Mood and Affect: Mood normal.        Behavior: Behavior normal.    UC Treatments / Results  Labs (all labs  ordered are listed, but only abnormal results are displayed) Labs Reviewed - No data to display  EKG None  Radiology No results found.  Procedures Procedures  Medications Ordered in UC Medications - No data to display  Initial Impression /  Assessment and Plan / UC Course  I have reviewed the triage vital signs and the nursing notes. Pertinent lab results that were available during my care of the patient were reviewed by me and considered in my medical decision making (see chart for details).  I suspect she has influenza and may have Otitis media, so I placed her on Tamiflu and Zithromax to cover bronchitis. I also prescribed her Tessalon perless.  Is she gets worse, she needs to be seen.    Final Clinical Impressions(s) / UC Diagnoses   Final diagnoses:  None   Discharge Instructions   None    ED Prescriptions    None     Controlled Substance Prescriptions [place] Controlled Substance Registry consulted?    Garey Ham, Cordelia Poche 08/26/18 2121    Rodriguez-SouthworthNettie Elm, PA-C 09/23/18 1809

## 2018-08-26 NOTE — Discharge Instructions (Addendum)
Azithromycin es antibiotico para el oido..  Pon gotas de Debrox en el oido izquierdo cada noche para que se suavice la cera x 7-10 dias. Por 10 minutos.  Ven a mi oficina para que te lavemos.

## 2018-08-26 NOTE — ED Triage Notes (Signed)
Pt c/o sore throat and bilateral ear pain x3 days.

## 2018-08-29 LAB — CULTURE, GROUP A STREP (THRC)

## 2019-07-16 NOTE — L&D Delivery Note (Addendum)
Patient is 34 y.o. [id number] [redacted]w[redacted]d admitted decreased fetal movement.   Delivery Note At 8:58 AM a viable female was delivered via Vaginal, Spontaneous (Presentation: Middle Occiput Anterior).  APGAR: 9, ; weight  pending.   Placenta status: Spontaneous, Intact.  Cord: 3V  Anesthesia: Epidural Episiotomy: None Lacerations: 2nd degree Suture Repair: 3.0 vicryl Est. Blood Loss (mL):  200  Mom to postpartum.  Baby to Couplet care / Skin to Skin.   Upon arrival patient was complete and pushing. She pushed with good maternal effort to deliver a healthy baby boy. Baby delivered without difficulty, was noted to have good tone and place on maternal abdomen for oral suctioning, drying and stimulation. Delayed cord clamping performed. Placenta delivered intact with 3V cord. Vaginal canal and perineum was inspected and found to have a second degree perineal laceration which was repaired and left hemostatic. Pitocin was started and uterus massaged until bleeding slowed. Counts of sharps, instruments, and lap pads were all correct.     Mirian Mo, MD PGY-3 8/13/20219:41 AM  I attest that I was gowned and gloved for the delivery of infant, placenta and repair.   Luna Kitchens

## 2019-08-13 NOTE — Progress Notes (Signed)
Last pap 04/15/2017-normal

## 2019-08-16 ENCOUNTER — Encounter: Payer: Self-pay | Admitting: Obstetrics and Gynecology

## 2019-08-16 ENCOUNTER — Ambulatory Visit (INDEPENDENT_AMBULATORY_CARE_PROVIDER_SITE_OTHER): Payer: Self-pay | Admitting: Obstetrics and Gynecology

## 2019-08-16 ENCOUNTER — Other Ambulatory Visit: Payer: Self-pay

## 2019-08-16 VITALS — BP 119/78 | HR 77 | Wt 163.0 lb

## 2019-08-16 DIAGNOSIS — Z3401 Encounter for supervision of normal first pregnancy, first trimester: Secondary | ICD-10-CM

## 2019-08-16 DIAGNOSIS — Z124 Encounter for screening for malignant neoplasm of cervix: Secondary | ICD-10-CM

## 2019-08-16 DIAGNOSIS — Z603 Acculturation difficulty: Secondary | ICD-10-CM

## 2019-08-16 DIAGNOSIS — Z789 Other specified health status: Secondary | ICD-10-CM | POA: Insufficient documentation

## 2019-08-16 DIAGNOSIS — Z118 Encounter for screening for other infectious and parasitic diseases: Secondary | ICD-10-CM

## 2019-08-16 DIAGNOSIS — Z1151 Encounter for screening for human papillomavirus (HPV): Secondary | ICD-10-CM

## 2019-08-16 DIAGNOSIS — Z113 Encounter for screening for infections with a predominantly sexual mode of transmission: Secondary | ICD-10-CM

## 2019-08-16 DIAGNOSIS — Z3A11 11 weeks gestation of pregnancy: Secondary | ICD-10-CM

## 2019-08-16 DIAGNOSIS — Z3481 Encounter for supervision of other normal pregnancy, first trimester: Secondary | ICD-10-CM

## 2019-08-16 MED ORDER — PROMETHAZINE HCL 25 MG PO TABS: 25.0000 mg | ORAL_TABLET | Freq: Four times a day (QID) | ORAL | 1 refills | Status: DC | PRN

## 2019-08-16 NOTE — Patient Instructions (Addendum)
Today you are 11 weeks and 3 days  Your due date is August 20th

## 2019-08-16 NOTE — Progress Notes (Signed)
DATING AND VIABILITY SONOGRAM   Emily [name] is a 34 y.o. year old G20P2002 with LMP Patient's last menstrual period was 05/28/2019 (exact date). which would correlate to  [redacted]w[redacted]d weeks gestation.  She has regular menstrual cycles.   She is here today for a confirmatory initial sonogram.    GESTATION: SINGLETON yes     FETAL ACTIVITY:          Heart rate    170          The fetus is active.    GESTATIONAL AGE AND  BIOMETRICS:  Gestational criteria: Estimated Date of Delivery: 03/03/20 by LMP now at [redacted]w[redacted]d  Previous Scans:0  GESTATIONAL SAC                      CROWN RUMP LENGTH         3.54 mm         10. 3weeks                                                   AVERAGE EGA(BY THIS SCAN):  10.3 weeks  WORKING EDD( LMP ):  03/03/2020     TECHNICIAN COMMENTS:  Patient informed that the ultrasound is considered a limited obstetric ultrasound and is not intended to be a complete ultrasound exam. Patient also informed that the ultrasound is not being completed with the intent of assessing for fetal or placental anomalies or any pelvic abnormalities. Explained that the purpose of today's ultrasound is to assess for fetal heart rate. Patient acknowledges the purpose of the exam and the limitations of the study.       A copy of this report including all images has been saved and backed up to a second source for retrieval if needed. All measures and details of the anatomical scan, placentation, fluid volume and pelvic anatomy are contained in that report.  Scheryl Marten 08/16/2019 2:07 PM

## 2019-08-16 NOTE — Progress Notes (Signed)
New OB Note  08/16/2019   Clinic: [facility]  Chief Complaint: NOB  Transfer of Care Patient: no  History of Present Illness: Ms. Emily [name] is a 34 y.o. [id number] @ 11/3 weeks (EDC 8/20, based on Patient's last menstrual period was 05/28/2019 (exact date).=11wk u/s today).  Preg complicated by has Encounter for supervision of normal first pregnancy in first trimester and Language barrier on their problem list.   Any events prior to today's visit: no Her periods were: qmonth, regular She was using no method when she conceived.  She has mild signs or symptoms of nausea/vomiting of pregnancy. She has Negative signs or symptoms of miscarriage or preterm labor On any medications around the time she conceived/early pregnancy: No   ROS: A 12-point review of systems was performed and negative, except as stated in the above HPI.  OBGYN History: As per HPI. OB History  Gravida Para Term Preterm AB Living  3 2 2    0 2  SAB TAB Ectopic Multiple Live Births      0 0 2    # Outcome Date GA Lbr Len/2nd Weight Sex Delivery Anes PTL Lv  3 Current           2 Term 10/19/15 [redacted]w[redacted]d 11:05 / 00:43 6 lb 12.6 oz (3.079 kg) F Vag-Spont EPI  LIV  1 Term 08/30/13 [redacted]w[redacted]d 10:57 / 01:10 7 lb 12 oz (3.515 kg) M Vag-Vacuum EPI  LIV    Any issues with any prior pregnancies: no Prior children are healthy, doing well, and without any problems or issues: yes History of pap smears: Yes. Last pap smear 2018 and results were negative   Past Medical History: Past Medical History:  Diagnosis Date  . Medical history non-contributory     Past Surgical History: Past Surgical History:  Procedure Laterality Date  . NO PAST SURGERIES      Family History:  Family History  Problem Relation Age of Onset  . Asthma Neg Hx   . Arthritis Neg Hx   . Alcohol abuse Neg Hx   . Birth defects Neg Hx   . COPD Neg Hx   . Cancer Neg Hx   . Depression Neg Hx   . Diabetes Neg Hx   . Drug  abuse Neg Hx   . Early death Neg Hx   . Hearing loss Neg Hx   . Heart disease Neg Hx   . Hypertension Neg Hx   . Hyperlipidemia Neg Hx   . Kidney disease Neg Hx   . Learning disabilities Neg Hx   . Mental illness Neg Hx   . Mental retardation Neg Hx   . Miscarriages / Stillbirths Neg Hx   . Stroke Neg Hx   . Vision loss Neg Hx   . Varicose Veins Neg Hx     Social History:  Social History   Socioeconomic History  . Marital status: Married    Spouse name: Not on file  . Number of children: Not on file  . Years of education: Not on file  . Highest education level: Not on file  Occupational History  . Not on file  Tobacco Use  . Smoking status: Never Smoker  . Smokeless tobacco: Never Used  Substance and Sexual Activity  . Alcohol use: Yes  . Drug use: No  . Sexual activity: Yes    Comment: intercourse age 27,less than 5 sexual partners  Other Topics Concern  . Not on file  Social History Narrative  .  Not on file   Social Determinants of Health   Financial Resource Strain:   . Difficulty of Paying Living Expenses: Not on file  Food Insecurity:   . Worried About Charity fundraiser in the Last Year: Not on file  . Ran Out of Food in the Last Year: Not on file  Transportation Needs:   . Lack of Transportation (Medical): Not on file  . Lack of Transportation (Non-Medical): Not on file  Physical Activity:   . Days of Exercise per Week: Not on file  . Minutes of Exercise per Session: Not on file  Stress:   . Feeling of Stress : Not on file  Social Connections:   . Frequency of Communication with Friends and Family: Not on file  . Frequency of Social Gatherings with Friends and Family: Not on file  . Attends Religious Services: Not on file  . Active Member of Clubs or Organizations: Not on file  . Attends Archivist Meetings: Not on file  . Marital Status: Not on file  Intimate Partner Violence:   . Fear of Current or Ex-Partner: Not on file  .  Emotionally Abused: Not on file  . Physically Abused: Not on file  . Sexually Abused: Not on file    Allergy: No Known Allergies  Health Maintenance:  Mammogram Up to Date: not applicable  Current Outpatient Medications: PNV  Physical Exam:   BP 119/78   Pulse 77   Wt 163 lb (73.9 kg)   LMP 05/28/2019 (Exact Date)   BMI 30.80 kg/m  Body mass index is 30.8 kg/m. Contractions: Not present Vag. Bleeding: None. Fundal height: not applicable FHTs: 703J  General appearance: Well nourished, well developed female in no acute distress.  Neck:  Supple, normal appearance, and no thyromegaly  Cardiovascular: S1, S2 normal, no murmur, rub or gallop, regular rate and rhythm Respiratory:  Clear to auscultation bilateral. Normal respiratory effort Abdomen: positive bowel sounds and no masses, hernias; diffusely non tender to palpation, non distended Breasts: pt declines any breast s/s. Neuro/Psych:  Normal mood and affect.  Skin:  Warm and dry.  Lymphatic:  No inguinal lymphadenopathy.   Pelvic exam: is not limited by body habitus EGBUS: within normal limits, Vagina: within normal limits and with no blood in the vault, Cervix: normal appearing cervix without discharge or lesions, closed/long/high, Uterus:  enlarged, c/w 10 week size, and Adnexa:  normal adnexa and no mass, fullness, tenderness  Laboratory: none  Imaging:  Bedside u/s with SLIUP, CRL 10/3, FHR 160s, +FM  Assessment: pt doing well  Plan: 1. Encounter for supervision of normal first pregnancy in first trimester Routine care. Anatomy u/s ordered for the HD Phenergan sent in. Declines genetics  - Obstetric Panel, Including HIV - Culture, OB Urine - Cytology - PAP - US OB Comp + 14 Wk; Future - Enroll Patient in Babyscripts - Babyscripts Schedule Optimization  2. Language barrier Interpreter used  Problem list reviewed and updated.  Follow up in 8 weeks. Per babyscripts. Virtual okay if can do with adopt  a mom pts  The nature of [facility] with multiple MDs and other Advanced Practice Providers was explained to patient; also emphasized that residents, students are part of our team.  >50% of 25 min visit spent on counseling and coordination of care.     Durene Romans MD Attending Center for [facility])

## 2019-08-17 LAB — OBSTETRIC PANEL, INCLUDING HIV
Antibody Screen: NEGATIVE
Basophils Absolute: 0.1 10*3/uL (ref 0.0–0.2)
Basos: 1 %
EOS (ABSOLUTE): 0.1 10*3/uL (ref 0.0–0.4)
Eos: 1 %
HIV Screen 4th Generation wRfx: NONREACTIVE
Hematocrit: 44 % (ref 34.0–46.6)
Hemoglobin: 14.8 g/dL (ref 11.1–15.9)
Hepatitis B Surface Ag: NEGATIVE
Immature Grans (Abs): 0 10*3/uL (ref 0.0–0.1)
Immature Granulocytes: 0 %
Lymphocytes Absolute: 1.7 10*3/uL (ref 0.7–3.1)
Lymphs: 20 %
MCH: 29.7 pg (ref 26.6–33.0)
MCHC: 33.6 g/dL (ref 31.5–35.7)
MCV: 88 fL (ref 79–97)
Monocytes Absolute: 0.5 10*3/uL (ref 0.1–0.9)
Monocytes: 6 %
Neutrophils Absolute: 6.4 10*3/uL (ref 1.4–7.0)
Neutrophils: 72 %
Platelets: 235 10*3/uL (ref 150–450)
RBC: 4.98 x10E6/uL (ref 3.77–5.28)
RDW: 13.1 % (ref 11.7–15.4)
RPR Ser Ql: NONREACTIVE
Rh Factor: POSITIVE
Rubella Antibodies, IGG: 7.43 index (ref >0.99)
WBC: 8.8 10*3/uL (ref 3.4–10.8)

## 2019-08-18 LAB — URINE CULTURE, OB REFLEX

## 2019-08-18 LAB — CULTURE, OB URINE

## 2019-08-19 LAB — CYTOLOGY - PAP
Chlamydia: NEGATIVE
Comment: NEGATIVE
Comment: NEGATIVE
Comment: NORMAL
Diagnosis: NEGATIVE
High risk HPV: NEGATIVE
Neisseria Gonorrhea: NEGATIVE

## 2019-10-11 ENCOUNTER — Encounter: Payer: Self-pay | Admitting: Radiology

## 2019-10-11 ENCOUNTER — Other Ambulatory Visit: Payer: Self-pay

## 2019-10-11 ENCOUNTER — Ambulatory Visit (INDEPENDENT_AMBULATORY_CARE_PROVIDER_SITE_OTHER): Payer: Self-pay | Admitting: Family Medicine

## 2019-10-11 VITALS — BP 112/73 | HR 79 | Wt 167.0 lb

## 2019-10-11 DIAGNOSIS — Z789 Other specified health status: Secondary | ICD-10-CM

## 2019-10-11 DIAGNOSIS — Z3492 Encounter for supervision of normal pregnancy, unspecified, second trimester: Secondary | ICD-10-CM

## 2019-10-11 DIAGNOSIS — Z3A19 19 weeks gestation of pregnancy: Secondary | ICD-10-CM

## 2019-10-11 NOTE — Patient Instructions (Signed)
 Lactancia materna Breastfeeding  Decidir amamantar es una de las mejores elecciones que puede hacer por usted y su beb. Un cambio en las hormonas durante el embarazo hace que las mamas produzcan leche materna en las glndulas productoras de leche. Las hormonas impiden que la leche materna sea liberada antes del nacimiento del beb. Adems, impulsan el flujo de leche luego del nacimiento. Una vez que ha comenzado a amamantar, pensar en el beb, as como la succin o el llanto, pueden estimular la liberacin de leche de las glndulas productoras de leche. Los beneficios de amamantar Las investigaciones demuestran que la lactancia materna ofrece muchos beneficios de salud para bebs y madres. Adems, ofrece una forma gratuita y conveniente de alimentar al beb. Para el beb  La primera leche (calostro) ayuda a mejorar el funcionamiento del aparato digestivo del beb.  Las clulas especiales de la leche (anticuerpos) ayudan a combatir las infecciones en el beb.  Los bebs que se alimentan con leche materna tambin tienen menos probabilidades de tener asma, alergias, obesidad o diabetes de tipo 2. Adems, tienen menor riesgo de sufrir el sndrome de muerte sbita del lactante (SMSL).  Los nutrientes de la leche materna son mejores para satisfacer las necesidades del beb en comparacin con la leche maternizada.  La leche materna mejora el desarrollo cerebral del beb. Para usted  La lactancia materna favorece el desarrollo de un vnculo muy especial entre la madre y el beb.  Es conveniente. La leche materna es econmica y siempre est disponible a la temperatura correcta.  La lactancia materna ayuda a quemar caloras. Le ayuda a perder el peso ganado durante el embarazo.  Hace que el tero vuelva al tamao que tena antes del embarazo ms rpido. Adems, disminuye el sangrado (loquios) despus del parto.  La lactancia materna contribuye a reducir el riesgo de tener diabetes de tipo 2,  osteoporosis, artritis reumatoide, enfermedades cardiovasculares y cncer de mama, ovario, tero y endometrio en el futuro. Informacin bsica sobre la lactancia Comienzo de la lactancia  Encuentre un lugar cmodo para sentarse o acostarse, con un buen respaldo para el cuello y la espalda.  Coloque una almohada o una manta enrollada debajo del beb para acomodarlo a la altura de la mama (si est sentada). Las almohadas para amamantar se han diseado especialmente a fin de servir de apoyo para los brazos y el beb mientras amamanta.  Asegrese de que la barriga del beb (abdomen) est frente a la suya.  Masajee suavemente la mama. Con las yemas de los dedos, masajee los bordes exteriores de la mama hacia adentro, en direccin al pezn. Esto estimula el flujo de leche. Si la leche fluye lentamente, es posible que deba continuar con este movimiento durante la lactancia.  Sostenga la mama con 4 dedos por debajo y el pulgar por arriba del pezn (forme la letra "C" con la mano). Asegrese de que los dedos se encuentren lejos del pezn y de la boca del beb.  Empuje suavemente los labios del beb con el pezn o con el dedo.  Cuando la boca del beb se abra lo suficiente, acrquelo rpidamente a la mama e introduzca todo el pezn y la arola, tanto como sea posible, dentro de la boca del beb. La arola es la zona de color que rodea al pezn. ? Debe haber ms arola visible por arriba del labio superior del beb que por debajo del labio inferior. ? Los labios del beb deben estar abiertos y extendidos hacia afuera (evertidos) para asegurar   que el beb se prenda de forma adecuada y cmoda. ? La lengua del beb debe estar entre la enca inferior y la mama.  Asegrese de que la boca del beb est en la posicin correcta alrededor del pezn (prendido). Los labios del beb deben crear un sello sobre la mama y estar doblados hacia afuera (invertidos).  Es comn que el beb succione durante 2 a 3 minutos  para que comience el flujo de leche materna. Cmo debe prenderse Es muy importante que le ensee al beb cmo prenderse adecuadamente a la mama. Si el beb no se prende adecuadamente, puede causar dolor en los pezones, reducir la produccin de leche materna y hacer que el beb tenga un escaso aumento de peso. Adems, si el beb no se prende adecuadamente al pezn, puede tragar aire durante la alimentacin. Esto puede causarle molestias al beb. Hacer eructar al beb al cambiar de mama puede ayudarlo a liberar el aire. Sin embargo, ensearle al beb cmo prenderse a la mama adecuadamente es la mejor manera de evitar que se sienta molesto por tragar aire mientras se alimenta. Signos de que el beb se ha prendido adecuadamente al pezn  Tironea o succiona de modo silencioso, sin causarle dolor. Los labios del beb deben estar extendidos hacia afuera (evertidos).  Se escucha que traga cada 3 o 4 succiones una vez que la leche ha comenzado a fluir (despus de que se produzca el reflejo de eyeccin de la leche).  Hay movimientos musculares por arriba y por delante de sus odos al succionar. Signos de que el beb no se ha prendido adecuadamente al pezn  Hace ruidos de succin o de chasquido mientras se alimenta.  Siente dolor en los pezones. Si cree que el beb no se prendi correctamente, deslice el dedo en la comisura de la boca y colquelo entre las encas del beb para interrumpir la succin. Intente volver a comenzar a amamantar. Signos de lactancia materna exitosa Signos del beb  El beb disminuir gradualmente el nmero de succiones o dejar de succionar por completo.  El beb se quedar dormido.  El cuerpo del beb se relajar.  El beb retendr una pequea cantidad de leche en la boca.  El beb se desprender solo del pecho. Signos que presenta usted  Las mamas han aumentado la firmeza, el peso y el tamao 1 a 3 horas despus de amamantar.  Estn ms blandas inmediatamente despus  de amamantar.  Se producen un aumento del volumen de leche y un cambio en su consistencia y color hacia el quinto da de lactancia.  Los pezones no duelen, no estn agrietados ni sangran. Signos de que su beb recibe la cantidad de leche suficiente  Mojar por lo menos 1 o 2paales durante las primeras 24horas despus del nacimiento.  Mojar por lo menos 5 o 6paales cada 24horas durante la primera semana despus del nacimiento. La orina debe ser clara o de color amarillo plido a los 5das de vida.  Mojar entre 6 y 8paales cada 24horas a medida que el beb sigue creciendo y desarrollndose.  Defeca por lo menos 3 veces en 24 horas a los 5 das de vida. Las heces deben ser blandas y amarillentas.  Defeca por lo menos 3 veces en 24 horas a los 7 das de vida. Las heces deben ser grumosas y amarillentas.  No registra una prdida de peso mayor al 10% del peso al nacer durante los primeros 3 das de vida.  Aumenta de peso un promedio de 4   a 7onzas (113 a 198g) por semana despus de los 4 das de vida.  Aumenta de peso, diariamente, de manera uniforme a partir de los 5 das de vida, sin registrar prdida de peso despus de las 2semanas de vida. Despus de alimentarse, es posible que el beb regurgite una pequea cantidad de leche. Esto es normal. Frecuencia y duracin de la lactancia El amamantamiento frecuente la ayudar a producir ms leche y puede prevenir dolores en los pezones y las mamas extremadamente llenas (congestin mamaria). Alimente al beb cuando muestre signos de hambre o si siente la necesidad de reducir la congestin de las mamas. Esto se denomina "lactancia a demanda". Las seales de que el beb tiene hambre incluyen las siguientes:  Aumento del estado de alerta, actividad o inquietud.  Mueve la cabeza de un lado a otro.  Abre la boca cuando se le toca la mejilla o la comisura de la boca (reflejo de bsqueda).  Aumenta las vocalizaciones, tales como sonidos de  succin, se relame los labios, emite arrullos, suspiros o chirridos.  Mueve la mano hacia la boca y se chupa los dedos o las manos.  Est molesto o llora. Evite el uso del chupete en las primeras 4 a 6 semanas despus del nacimiento del beb. Despus de este perodo, podr usar un chupete. Las investigaciones demostraron que el uso del chupete durante el primer ao de vida del beb disminuye el riesgo de tener el sndrome de muerte sbita del lactante (SMSL). Permita que el nio se alimente en cada mama todo lo que desee. Cuando el beb se desprende o se queda dormido mientras se est alimentando de la primera mama, ofrzcale la segunda. Debido a que, con frecuencia, los recin nacidos estn somnolientos las primeras semanas de vida, es posible que deba despertar al beb para alimentarlo. Los horarios de lactancia varan de un beb a otro. Sin embargo, las siguientes reglas pueden servir como gua para ayudarla a garantizar que el beb se alimenta adecuadamente:  Se puede amamantar a los recin nacidos (bebs de 4 semanas o menos de vida) cada 1 a 3 horas.  No deben transcurrir ms de 3 horas durante el da o 5 horas durante la noche sin que se amamante a los recin nacidos.  Debe amamantar al beb un mnimo de 8 veces en un perodo de 24 horas. Extraccin de leche materna     La extraccin y el almacenamiento de la leche materna le permiten asegurarse de que el beb se alimente exclusivamente de su leche materna, aun en momentos en los que no puede amamantar. Esto tiene especial importancia si debe regresar al trabajo en el perodo en que an est amamantando o si no puede estar presente en los momentos en que el beb debe alimentarse. Su asesor en lactancia puede ayudarla a encontrar un mtodo de extraccin que funcione mejor para usted y orientarla sobre cunto tiempo es seguro almacenar leche materna. Cmo cuidar las mamas durante la lactancia Los pezones pueden secarse, agrietarse y doler  durante la lactancia. Las siguientes recomendaciones pueden ayudarla a mantener las mamas humectadas y sanas:  Evite usar jabn en los pezones.  Use un sostn de soporte diseado especialmente para la lactancia materna. Evite usar sostenes con aro o sostenes muy ajustados (sostenes deportivos).  Seque al aire sus pezones durante 3 a 4minutos despus de amamantar al beb.  Utilice solo apsitos de algodn en el sostn para absorber las prdidas de leche. La prdida de un poco de leche materna entre   las tomas es normal.  Utilice lanolina sobre los pezones luego de amamantar. La lanolina ayuda a mantener la humedad normal de la piel. La lanolina pura no es perjudicial (no es txica) para el beb. Adems, puede extraer manualmente algunas gotas de leche materna y masajear suavemente esa leche sobre los pezones para que la leche se seque al aire. Durante las primeras semanas despus del nacimiento, algunas mujeres experimentan congestin mamaria. La congestin mamaria puede hacer que sienta las mamas pesadas, calientes y sensibles al tacto. El pico de la congestin mamaria ocurre en el plazo de los 3 a 5 das despus del parto. Las siguientes recomendaciones pueden ayudarla a aliviar la congestin mamaria:  Vace por completo las mamas al amamantar o extraer leche. Puede aplicar calor hmedo en las mamas (en la ducha o con toallas hmedas para manos) antes de amamantar o extraer leche. Esto aumenta la circulacin y ayuda a que la leche fluya. Si el beb no vaca por completo las mamas cuando lo amamanta, extraiga la leche restante despus de que haya finalizado.  Aplique compresas de hielo sobre las mamas inmediatamente despus de amamantar o extraer leche, a menos que le resulte demasiado incmodo. Haga lo siguiente: ? Ponga el hielo en una bolsa plstica. ? Coloque una toalla entre la piel y la bolsa de hielo. ? Coloque el hielo durante 20minutos, 2 o 3veces por da.  Asegrese de que el beb  est prendido y se encuentre en la posicin correcta mientras lo alimenta. Si la congestin mamaria persiste luego de 48 horas o despus de seguir estas recomendaciones, comunquese con su mdico o un asesor en lactancia. Recomendaciones de salud general durante la lactancia  Consuma 3 comidas y 3 colaciones saludables todos los das. Las madres bien alimentadas que amamantan necesitan entre 450 y 500 caloras adicionales por da. Puede cumplir con este requisito al aumentar la cantidad de una dieta equilibrada que realice.  Beba suficiente agua para mantener la orina clara o de color amarillo plido.  Descanse con frecuencia, reljese y siga tomando sus vitaminas prenatales para prevenir la fatiga, el estrs y los niveles bajos de vitaminas y minerales en el cuerpo (deficiencias de nutrientes).  No consuma ningn producto que contenga nicotina o tabaco, como cigarrillos y cigarrillos electrnicos. El beb puede verse afectado por las sustancias qumicas de los cigarrillos que pasan a la leche materna y por la exposicin al humo ambiental del tabaco. Si necesita ayuda para dejar de fumar, consulte al mdico.  Evite el consumo de alcohol.  No consuma drogas ilegales o marihuana.  Antes de usar cualquier medicamento, hable con el mdico. Estos incluyen medicamentos recetados y de venta libre, como tambin vitaminas y suplementos a base de hierbas. Algunos medicamentos, que pueden ser perjudiciales para el beb, pueden pasar a travs de la leche materna.  Puede quedar embarazada durante la lactancia. Si se desea un mtodo anticonceptivo, consulte al mdico sobre cules son las opciones seguras durante la lactancia. Dnde encontrar ms informacin: Liga internacional La Leche: www.llli.org. Comunquese con un mdico si:  Siente que quiere dejar de amamantar o se siente frustrada con la lactancia.  Sus pezones estn agrietados o sangran.  Sus mamas estn irritadas, sensibles o  calientes.  Tiene los siguientes sntomas: ? Dolor en las mamas o en los pezones. ? Un rea hinchada en cualquiera de las mamas. ? Fiebre o escalofros. ? Nuseas o vmitos. ? Drenaje de otro lquido distinto de la leche materna desde los pezones.  Sus mamas no   se llenan antes de amamantar al beb para el quinto da despus del parto.  Se siente triste y deprimida.  El beb: ? Est demasiado somnoliento como para comer bien. ? Tiene problemas para dormir. ? Tiene ms de 1 semana de vida y moja menos de 6 paales en un periodo de 24 horas. ? No ha aumentado de peso a los 5 das de vida.  El beb defeca menos de 3 veces en 24 horas.  La piel del beb o las partes blancas de los ojos se vuelven amarillentas. Solicite ayuda de inmediato si:  El beb est muy cansado (letargo) y no se quiere despertar para comer.  Le sube la fiebre sin causa. Resumen  La lactancia materna ofrece muchos beneficios de salud para bebs y madres.  Intente amamantar a su beb cuando muestre signos tempranos de hambre.  Haga cosquillas o empuje suavemente los labios del beb con el dedo o el pezn para lograr que el beb abra la boca. Acerque el beb a la mama. Asegrese de que la mayor parte de la arola se encuentre dentro de la boca del beb. Ofrzcale una mama y haga eructar al beb antes de pasar a la otra.  Hable con su mdico o asesor en lactancia si tiene dudas o problemas con la lactancia. Esta informacin no tiene como fin reemplazar el consejo del mdico. Asegrese de hacerle al mdico cualquier pregunta que tenga. Document Revised: 09/25/2017 Document Reviewed: 10/21/2016 Elsevier Patient Education  2020 Elsevier Inc.  

## 2019-10-11 NOTE — Progress Notes (Signed)
Had ultrasound done today

## 2019-10-11 NOTE — Progress Notes (Signed)
   PRENATAL VISIT NOTE  Subjective:  Emily [name] is a 34 y.o. G3P2002 at [redacted]w[redacted]d being seen today for ongoing prenatal care.  She is currently monitored for the following issues for this low-risk pregnancy and has Supervision of low-risk pregnancy and Language barrier on their problem list.  Patient reports no complaints.  Contractions: Not present. Vag. Bleeding: None.   . Denies leaking of fluid.   The following portions of the patient's history were reviewed and updated as appropriate: allergies, current medications, past family history, past medical history, past social history, past surgical history and problem list.   Objective:   Vitals:   10/11/19 1316  BP: 112/73  Pulse: 79  Weight: 167 lb (75.8 kg)    Fetal Status: Fetal Heart Rate (bpm): 156 Fundal Height: 19 cm       General:  Alert, oriented and cooperative. Patient is in no acute distress.  Skin: Skin is warm and dry. No rash noted.   Cardiovascular: Normal heart rate noted  Respiratory: Normal respiratory effort, no problems with respiration noted  Abdomen: Soft, gravid, appropriate for gestational age.  Pain/Pressure: Absent     Pelvic: Cervical exam deferred        Extremities: Normal range of motion.     Mental Status: Normal mood and affect. Normal behavior. Normal judgment and thought content.   Assessment and Plan:  Pregnancy: G3P2002 at [redacted]w[redacted]d 1. Encounter for supervision of low-risk pregnancy in second trimester Had anatomy u/s today--no report yet Declined AFP or quad.  2. Language barrier Spanish interpreter: Nohelia used  General obstetric precautions including but not limited to vaginal bleeding, contractions, leaking of fluid and fetal movement were reviewed in detail with the patient. Please refer to After Visit Summary for other counseling recommendations.   Return in 4 weeks (on 11/08/2019) for in person per pt preference.  Future Appointments  Date Time Provider Department Center   11/09/2019 10:00 AM Valdese Bing, MD [facility]    Reva Bores, MD

## 2019-10-15 ENCOUNTER — Ambulatory Visit: Payer: Self-pay | Attending: Internal Medicine

## 2019-10-15 ENCOUNTER — Ambulatory Visit: Payer: Self-pay

## 2019-10-15 DIAGNOSIS — Z23 Encounter for immunization: Secondary | ICD-10-CM

## 2019-10-15 NOTE — Progress Notes (Signed)
   Covid-19 Vaccination Clinic  Name:  Emily [name]    MRN: [id number] DOB: October 24, 1985  10/15/2019  Emily [name] was observed post Covid-19 immunization for 15 minutes without incident. She was provided with Vaccine Information Sheet and instruction to access the V-Safe system.   Emily [name] was instructed to call 911 with any severe reactions post vaccine: Marland Kitchen Difficulty breathing  . Swelling of face and throat  . A fast heartbeat  . A bad rash all over body  . Dizziness and weakness   Immunizations Administered    Name Date Dose VIS Date Route   Pfizer COVID-19 Vaccine 10/15/2019  3:56 PM 0.3 mL 06/25/2019 Intramuscular   Manufacturer: [employer], [employer]   Lot: [id number]   NDC: [address]-9

## 2019-11-05 ENCOUNTER — Ambulatory Visit: Payer: Self-pay | Attending: Internal Medicine

## 2019-11-05 DIAGNOSIS — Z23 Encounter for immunization: Secondary | ICD-10-CM

## 2019-11-05 NOTE — Progress Notes (Signed)
   Covid-19 Vaccination Clinic  Name:  Emily [name]    MRN: [id number] DOB: 1985-09-03  11/05/2019  Emily [name] was observed post Covid-19 immunization for 15 minutes without incident. She was provided with Vaccine Information Sheet and instruction to access the V-Safe system.   Emily [name] was instructed to call 911 with any severe reactions post vaccine: Marland Kitchen Difficulty breathing  . Swelling of face and throat  . A fast heartbeat  . A bad rash all over body  . Dizziness and weakness   Immunizations Administered    Name Date Dose VIS Date Route   Pfizer COVID-19 Vaccine 11/05/2019  3:51 PM 0.3 mL 09/08/2018 Intramuscular   Manufacturer: [employer], [employer]   Lot: [id number]   NDC: [address]-0

## 2019-11-09 ENCOUNTER — Other Ambulatory Visit: Payer: Self-pay

## 2019-11-09 ENCOUNTER — Ambulatory Visit (INDEPENDENT_AMBULATORY_CARE_PROVIDER_SITE_OTHER): Payer: Self-pay | Admitting: Obstetrics and Gynecology

## 2019-11-09 VITALS — BP 109/72 | HR 72 | Wt 169.2 lb

## 2019-11-09 DIAGNOSIS — O365930 Maternal care for other known or suspected poor fetal growth, third trimester, not applicable or unspecified: Secondary | ICD-10-CM | POA: Insufficient documentation

## 2019-11-09 DIAGNOSIS — Z789 Other specified health status: Secondary | ICD-10-CM

## 2019-11-09 DIAGNOSIS — O365920 Maternal care for other known or suspected poor fetal growth, second trimester, not applicable or unspecified: Secondary | ICD-10-CM

## 2019-11-09 DIAGNOSIS — Z3492 Encounter for supervision of normal pregnancy, unspecified, second trimester: Secondary | ICD-10-CM

## 2019-11-09 DIAGNOSIS — Z3A23 23 weeks gestation of pregnancy: Secondary | ICD-10-CM

## 2019-11-09 NOTE — Progress Notes (Signed)
Prenatal Visit Note Date: 11/09/2019 Clinic: [facility]  Subjective:  Emily [name] is a 34 y.o. G3P2002 at [redacted]w[redacted]d being seen today for ongoing prenatal care.  She is currently monitored for the following issues for this low-risk pregnancy and has Supervision of low-risk pregnancy; Language barrier; and Poor fetal growth affecting management of mother in second trimester on their problem list.  Patient reports no complaints.   Contractions: Not present.  .  Movement: Present. Denies leaking of fluid.   The following portions of the patient's history were reviewed and updated as appropriate: allergies, current medications, past family history, past medical history, past social history, past surgical history and problem list. Problem list updated.  Objective:   Vitals:   11/09/19 1014  BP: 109/72  Pulse: 72  Weight: 169 lb 3.2 oz (76.7 kg)    Fetal Status: Fetal Heart Rate (bpm): 154   Movement: Present     General:  Alert, oriented and cooperative. Patient is in no acute distress.  Skin: Skin is warm and dry. No rash noted.   Cardiovascular: Normal heart rate noted  Respiratory: Normal respiratory effort, no problems with respiration noted  Abdomen: Soft, gravid, appropriate for gestational age. Pain/Pressure: Absent     Pelvic:  Cervical exam deferred        Extremities: Normal range of motion.     Mental Status: Normal mood and affect. Normal behavior. Normal judgment and thought content.   Urinalysis:      Assessment and Plan:  Pregnancy: G3P2002 at [redacted]w[redacted]d  1. Poor fetal growth affecting management of mother in second trimester, single or unspecified fetus efw 5% on 3/29 [facility] u/s via [facility]. Pt with good dates. Follow up in 1-2wks ordered  2. Language barrier Interpreter used  3. Encounter for supervision of low-risk pregnancy in second trimester 28wk labs nv.   Preterm labor symptoms and general obstetric precautions including but  not limited to vaginal bleeding, contractions, leaking of fluid and fetal movement were reviewed in detail with the patient. Please refer to After Visit Summary for other counseling recommendations.  Return in about 1 month (around 12/09/2019) for low risk, 2hr GTT, in person.   Harmon Bing, MD

## 2019-11-09 NOTE — Addendum Note (Signed)
Addended by: Scheryl Marten on: 11/09/2019 11:24 AM   Modules accepted: Orders

## 2019-11-23 ENCOUNTER — Encounter: Payer: Self-pay | Admitting: Radiology

## 2019-12-07 ENCOUNTER — Encounter: Payer: Self-pay | Admitting: Obstetrics & Gynecology

## 2019-12-07 ENCOUNTER — Ambulatory Visit (INDEPENDENT_AMBULATORY_CARE_PROVIDER_SITE_OTHER): Payer: Self-pay | Admitting: Obstetrics & Gynecology

## 2019-12-07 ENCOUNTER — Other Ambulatory Visit: Payer: Self-pay

## 2019-12-07 ENCOUNTER — Encounter: Payer: Self-pay | Admitting: Radiology

## 2019-12-07 VITALS — BP 104/69 | HR 78 | Wt 174.8 lb

## 2019-12-07 DIAGNOSIS — O365920 Maternal care for other known or suspected poor fetal growth, second trimester, not applicable or unspecified: Secondary | ICD-10-CM

## 2019-12-07 DIAGNOSIS — O0992 Supervision of high risk pregnancy, unspecified, second trimester: Secondary | ICD-10-CM

## 2019-12-07 DIAGNOSIS — Z3A27 27 weeks gestation of pregnancy: Secondary | ICD-10-CM

## 2019-12-07 LAB — CBC
Hematocrit: 35.1 % (ref 34.0–46.6)
Hemoglobin: 11.6 g/dL (ref 11.1–15.9)
MCH: 29.4 pg (ref 26.6–33.0)
MCHC: 33 g/dL (ref 31.5–35.7)
MCV: 89 fL (ref 79–97)
Platelets: 181 10*3/uL (ref 150–450)
RBC: 3.95 x10E6/uL (ref 3.77–5.28)
RDW: 12.6 % (ref 11.7–15.4)
WBC: 6.1 10*3/uL (ref 3.4–10.8)

## 2019-12-07 NOTE — Patient Instructions (Signed)
Regrese a la clinica cuando tenga su cita. Si tiene problemas o preguntas, llama a la clinica o vaya a la sal[facility] al [facility].    

## 2019-12-07 NOTE — Progress Notes (Signed)
   PRENATAL VISIT NOTE  Subjective:  Emily [name] is a 34 y.o. G3P2002 at [redacted]w[redacted]d being seen today for ongoing prenatal care. Patient is Spanish-speaking only, interpreter present for this encounter. She is currently monitored for the following issues for this high-risk pregnancy and has Supervision of high-risk pregnancy; Language barrier; and Poor fetal growth affecting management of mother in second trimester on their problem list.  Patient reports no complaints.  Contractions: Not present.  .  Movement: Present. Denies leaking of fluid.   The following portions of the patient's history were reviewed and updated as appropriate: allergies, current medications, past family history, past medical history, past social history, past surgical history and problem list.   Objective:   Vitals:   12/07/19 0833  BP: 104/69  Pulse: 78  Weight: 174 lb 12.8 oz (79.3 kg)    Fetal Status: Fetal Heart Rate (bpm): 126   Movement: Present     General:  Alert, oriented and cooperative. Patient is in no acute distress.  Skin: Skin is warm and dry. No rash noted.   Cardiovascular: Normal heart rate noted  Respiratory: Normal respiratory effort, no problems with respiration noted  Abdomen: Soft, gravid, appropriate for gestational age.  Pain/Pressure: Absent     Pelvic: Cervical exam deferred        Extremities: Normal range of motion.     Mental Status: Normal mood and affect. Normal behavior. Normal judgment and thought content.   Assessment and Plan:  Pregnancy: G3P2002 at [redacted]w[redacted]d 1. Poor fetal growth affecting management of mother in second trimester, single or unspecified fetus Recent u/s on 11/22/19 at [place] showed persistent FGR at 5% (by [facility] criteria). Dated by LMP c/w 10 week scan. Normal anatomy and normal fluid.  Curbsided Dr. Grace Bushy (MFM), he recommended MFM scan and dopplers in 2 weeks.  This was ordered. Explained to patient about need for MFM close surveillance given  her IUGR. - [place] MFM OB DETAIL +14 WK; Future - [place] MFM UA CORD DOPPLER; Future  2. [redacted] weeks gestation of pregnancy 3. Supervision of high risk pregnancy in second trimester - RPR - CBC - HIV Antibody (routine testing w rflx) - Glucose Tolerance, 2 Hours w/1 Hour She will get Tdap at [facility], letter provided.  Will follow up results of labs.  Preterm labor symptoms and general obstetric precautions including but not limited to vaginal bleeding, contractions, leaking of fluid and fetal movement were reviewed in detail with the patient. Please refer to After Visit Summary for other counseling recommendations.   No follow-ups on file.  Future Appointments  Date Time Provider Department Center  12/23/2019  2:45 PM [place] Bing, MD [facility]  12/24/2019  1:30 PM [facility] NURSE [facility]  12/24/2019  1:30 PM [facility] US3 [facility]    Jaynie Collins, MD

## 2019-12-08 LAB — GLUCOSE TOLERANCE, 2 HOURS W/ 1HR
Glucose, 1 hour: 155 mg/dL (ref 65–179)
Glucose, 2 hour: 103 mg/dL (ref 65–152)
Glucose, Fasting: 82 mg/dL (ref 65–91)

## 2019-12-08 LAB — RPR: RPR Ser Ql: NONREACTIVE

## 2019-12-08 LAB — HIV ANTIBODY (ROUTINE TESTING W REFLEX): HIV Screen 4th Generation wRfx: NONREACTIVE

## 2019-12-23 ENCOUNTER — Ambulatory Visit (INDEPENDENT_AMBULATORY_CARE_PROVIDER_SITE_OTHER): Payer: Self-pay | Admitting: Obstetrics and Gynecology

## 2019-12-23 ENCOUNTER — Other Ambulatory Visit: Payer: Self-pay

## 2019-12-23 VITALS — BP 117/75 | HR 72 | Wt 176.8 lb

## 2019-12-23 DIAGNOSIS — O365930 Maternal care for other known or suspected poor fetal growth, third trimester, not applicable or unspecified: Secondary | ICD-10-CM

## 2019-12-23 DIAGNOSIS — Z789 Other specified health status: Secondary | ICD-10-CM

## 2019-12-23 DIAGNOSIS — O0993 Supervision of high risk pregnancy, unspecified, third trimester: Secondary | ICD-10-CM

## 2019-12-23 DIAGNOSIS — O365920 Maternal care for other known or suspected poor fetal growth, second trimester, not applicable or unspecified: Secondary | ICD-10-CM

## 2019-12-23 DIAGNOSIS — Z3A29 29 weeks gestation of pregnancy: Secondary | ICD-10-CM

## 2019-12-23 DIAGNOSIS — Z603 Acculturation difficulty: Secondary | ICD-10-CM

## 2019-12-23 NOTE — Progress Notes (Signed)
Prenatal Visit Note Date: 12/23/2019 Clinic: [facility]  Subjective:  Emily [name] is a 34 y.o. K2H0623 at [redacted]w[redacted]d being seen today for ongoing prenatal care.  She is currently monitored for the following issues for this low-risk pregnancy and has Supervision of high-risk pregnancy; Language barrier; and Poor fetal growth affecting management of mother in second trimester on their problem list.  Patient reports no complaints.   Contractions: Not present. Vag. Bleeding: None.  Movement: Present. Denies leaking of fluid.   The following portions of the patient's history were reviewed and updated as appropriate: allergies, current medications, past family history, past medical history, past social history, past surgical history and problem list. Problem list updated.  Objective:   Vitals:   12/23/19 1455  BP: 117/75  Pulse: 72  Weight: 176 lb 12.8 oz (80.2 kg)    Fetal Status: Fetal Heart Rate (bpm): 136 Fundal Height: 31 cm Movement: Present     General:  Alert, oriented and cooperative. Patient is in no acute distress.  Skin: Skin is warm and dry. No rash noted.   Cardiovascular: Normal heart rate noted  Respiratory: Normal respiratory effort, no problems with respiration noted  Abdomen: Soft, gravid, appropriate for gestational age. Pain/Pressure: Absent     Pelvic:  Cervical exam deferred        Extremities: Normal range of motion.     Mental Status: Normal mood and affect. Normal behavior. Normal judgment and thought content.   Urinalysis:      Assessment and Plan:  Pregnancy: G3P2002 at [redacted]w[redacted]d  1. Supervision of high risk pregnancy in third trimester Routine care. Normal 28wk labs last visit  2. Language barrier Interpreter used  3. Poor fetal growth affecting management of mother in second trimester, single or unspecified fetus Has mfm u/s tomorrow to see if has true FGR  Preterm labor symptoms and general obstetric precautions  including but not limited to vaginal bleeding, contractions, leaking of fluid and fetal movement were reviewed in detail with the patient. Please refer to After Visit Summary for other counseling recommendations.  Return in about 2 weeks (around 01/06/2020) for 2-3wk in person low risk.   [place] Bing, MD

## 2019-12-23 NOTE — Progress Notes (Signed)
tdap needed.

## 2019-12-24 ENCOUNTER — Other Ambulatory Visit: Payer: Self-pay | Admitting: *Deleted

## 2019-12-24 ENCOUNTER — Ambulatory Visit: Payer: Self-pay | Attending: Obstetrics and Gynecology

## 2019-12-24 ENCOUNTER — Ambulatory Visit: Payer: Self-pay | Admitting: *Deleted

## 2019-12-24 ENCOUNTER — Other Ambulatory Visit: Payer: Self-pay

## 2019-12-24 DIAGNOSIS — Z363 Encounter for antenatal screening for malformations: Secondary | ICD-10-CM

## 2019-12-24 DIAGNOSIS — O0993 Supervision of high risk pregnancy, unspecified, third trimester: Secondary | ICD-10-CM | POA: Insufficient documentation

## 2019-12-24 DIAGNOSIS — Z3A30 30 weeks gestation of pregnancy: Secondary | ICD-10-CM

## 2019-12-24 DIAGNOSIS — O365930 Maternal care for other known or suspected poor fetal growth, third trimester, not applicable or unspecified: Secondary | ICD-10-CM

## 2019-12-24 DIAGNOSIS — O365990 Maternal care for other known or suspected poor fetal growth, unspecified trimester, not applicable or unspecified: Secondary | ICD-10-CM

## 2019-12-24 DIAGNOSIS — O365920 Maternal care for other known or suspected poor fetal growth, second trimester, not applicable or unspecified: Secondary | ICD-10-CM | POA: Insufficient documentation

## 2019-12-31 ENCOUNTER — Other Ambulatory Visit (HOSPITAL_COMMUNITY): Payer: Self-pay | Admitting: Obstetrics

## 2019-12-31 ENCOUNTER — Other Ambulatory Visit: Payer: Self-pay

## 2019-12-31 ENCOUNTER — Ambulatory Visit: Payer: Self-pay | Admitting: *Deleted

## 2019-12-31 ENCOUNTER — Ambulatory Visit: Payer: Self-pay | Attending: Obstetrics

## 2019-12-31 DIAGNOSIS — O0993 Supervision of high risk pregnancy, unspecified, third trimester: Secondary | ICD-10-CM | POA: Insufficient documentation

## 2019-12-31 DIAGNOSIS — Z363 Encounter for antenatal screening for malformations: Secondary | ICD-10-CM

## 2019-12-31 DIAGNOSIS — Z3A31 31 weeks gestation of pregnancy: Secondary | ICD-10-CM

## 2019-12-31 DIAGNOSIS — O365990 Maternal care for other known or suspected poor fetal growth, unspecified trimester, not applicable or unspecified: Secondary | ICD-10-CM

## 2019-12-31 DIAGNOSIS — O365930 Maternal care for other known or suspected poor fetal growth, third trimester, not applicable or unspecified: Secondary | ICD-10-CM

## 2020-01-04 ENCOUNTER — Ambulatory Visit (INDEPENDENT_AMBULATORY_CARE_PROVIDER_SITE_OTHER): Payer: Self-pay | Admitting: Obstetrics & Gynecology

## 2020-01-04 ENCOUNTER — Other Ambulatory Visit: Payer: Self-pay

## 2020-01-04 ENCOUNTER — Encounter: Payer: Self-pay | Admitting: Obstetrics & Gynecology

## 2020-01-04 VITALS — BP 107/71 | HR 90 | Wt 176.0 lb

## 2020-01-04 DIAGNOSIS — O365930 Maternal care for other known or suspected poor fetal growth, third trimester, not applicable or unspecified: Secondary | ICD-10-CM

## 2020-01-04 DIAGNOSIS — Z3A31 31 weeks gestation of pregnancy: Secondary | ICD-10-CM

## 2020-01-04 DIAGNOSIS — O0993 Supervision of high risk pregnancy, unspecified, third trimester: Secondary | ICD-10-CM

## 2020-01-04 NOTE — Patient Instructions (Signed)
Regrese a la clinica cuando tenga su cita. Si tiene problemas o preguntas, llama a la clinica o vaya a la sal[facility] al [facility].    

## 2020-01-04 NOTE — Progress Notes (Signed)
PRENATAL VISIT NOTE  Subjective:  Emily [name] is a 33 y.o. G3P2002 at [redacted]w[redacted]d being seen today for ongoing prenatal care. Patient is Spanish-speaking only, interpreter present for this encounter.  She is currently monitored for the following issues for this high-risk pregnancy and has Supervision of high-risk pregnancy; Language barrier; and Poor fetal growth affecting management of mother in singleton pregnancy in third trimester on their problem list.  Patient reports no complaints.  Contractions: Not present. Vag. Bleeding: None.  Movement: Present. Denies leaking of fluid.   The following portions of the patient's history were reviewed and updated as appropriate: allergies, current medications, past family history, past medical history, past social history, past surgical history and problem list.   Objective:   Vitals:   01/04/20 1605  BP: 107/71  Pulse: 90  Weight: 176 lb (79.8 kg)    Fetal Status: Fetal Heart Rate (bpm): 135   Movement: Present     General:  Alert, oriented and cooperative. Patient is in no acute distress.  Skin: Skin is warm and dry. No rash noted.   Cardiovascular: Normal heart rate noted  Respiratory: Normal respiratory effort, no problems with respiration noted  Abdomen: Soft, gravid, appropriate for gestational age.  Pain/Pressure: Absent     Pelvic: Cervical exam deferred        Extremities: Normal range of motion.  Edema: None  Mental Status: Normal mood and affect. Normal behavior. Normal judgment and thought content.   Imaging: [place] MFM FETAL BPP WO NON STRESS  Result Date: 12/31/2019 ----------------------------------------------------------------------  OBSTETRICS REPORT                       (Signed Final 12/31/2019 04:52 pm) ---------------------------------------------------------------------- Patient Info  ID #:       [id number]                          D.O.B.:  1986/06/20 (33 yrs)  Name:       Emily Seamen-             Visit  Date: 12/31/2019 04:03 pm              [name] ---------------------------------------------------------------------- Performed By  Attending:        Ma Rings MD         Ref. Address:     [address]                                                             [address]  Performed By:     Lenise Arena        [facility]                                     [facility]  Referred By:      [facility] ---------------------------------------------------------------------- Orders  #  Description  Code        Ordered By  1  [place] MFM FETAL BPP WO NON               [id number]    GARY FANG     STRESS  2  [place] MFM UA CORD DOPPLER                76820.02    Nigel Bridgeman ----------------------------------------------------------------------  #  Order #                     Accession #                Episode #  1  [id number]                   [id number]                 [id number]  2  [id number]                   [id number]                 [id number] ---------------------------------------------------------------------- Indications  Maternal care for known or suspected poor      O36.5930  fetal growth, third trimester, not applicable or  unspecified IUGR  Antenatal screening for malformations          Z36.3  [redacted] weeks gestation of pregnancy                Z3A.31 ---------------------------------------------------------------------- Vital Signs  Weight (lb): 176                               Height:        5'1"  BMI:         33.25 ---------------------------------------------------------------------- Fetal Evaluation  Num Of Fetuses:         1  Fetal Heart Rate(bpm):  133  Cardiac Activity:       Observed  Presentation:           Cephalic  Placenta:               Posterior  P. Cord Insertion:      Previously Visualized  Amniotic Fluid  AFI FV:      Within normal limits  AFI Sum(cm)     %Tile       Largest Pocket(cm)  16.1            58          4.6  RUQ(cm)        RLQ(cm)       LUQ(cm)        LLQ(cm)  4.6           4.3           4.5            2.7 ---------------------------------------------------------------------- Biophysical Evaluation  Amniotic F.V:   Within normal limits       F. Tone:        Observed  F. Movement:    Observed                   Score:          8/8  F. Breathing:   Observed ---------------------------------------------------------------------- Gestational Age  LMP:           31w 0d  Date:  05/28/19                 EDD:   03/03/20  Best:          Bobbye Riggs 0d     Det. By:  LMP  (05/28/19)          EDD:   03/03/20 ---------------------------------------------------------------------- Anatomy  Cranium:               Appears normal         LVOT:                   Appears normal  Cavum:                 Appears normal         Aortic Arch:            Appears normal  Ventricles:            Appears normal         Ductal Arch:            Appears normal  Choroid Plexus:        Appears normal         Diaphragm:              Appears normal  Cerebellum:            Appears normal         Stomach:                Appears normal, left                                                                        sided  Posterior Fossa:       Appears normal         Abdomen:                Appears normal  Nuchal Fold:           Not applicable (>20    Abdominal Wall:         Not well visualized                         wks GA)  Face:                  Appears normal         Cord Vessels:           Appears normal (3                         (orbits and profile)                           vessel cord)  Lips:                  Appears normal         Kidneys:                Appear normal  Palate:                Appears normal  Bladder:                Appears normal  Thoracic:              Appears normal         Spine:                  Appears normal  Heart:                 Appears normal         Upper Extremities:      Appears normal                         (4CH, axis, and                          situs)  RVOT:                  Appears normal         Lower Extremities:      Appears normal  Other:  Heels/feet and hands visualized. Nasal bone visualized. ---------------------------------------------------------------------- Doppler - Fetal Vessels  Umbilical Artery   S/D     %tile      RI    %tile                      PSV    ADFV    RDFV                                                     (cm/s)   2.63       42    0.62       45                     43.19      No      No ---------------------------------------------------------------------- Cervix Uterus Adnexa  Cervix  Not visualized (advanced GA >24wks) ---------------------------------------------------------------------- Comments  This patient was seen due to an IUGR fetus.  She denies any  problems since her last exam.  A biophysical profile performed today was 8 out of 8.  There was normal amniotic fluid noted on today's ultrasound  exam.  Doppler studies of the umbilical arteries performed due to  fetal growth restriction showed a normal S/D ratio of 2.63.  There were no signs of absent or reversed end-diastolic flow  noted today.  A follow-up exam was scheduled in 1 week. ----------------------------------------------------------------------                   Ma Rings, MD Electronically Signed Final Report   12/31/2019 04:52 pm ----------------------------------------------------------------------  [place] MFM OB DETAIL +14 WK  Result Date: 12/24/2019 ----------------------------------------------------------------------  OBSTETRICS REPORT                       (Signed Final 12/24/2019 03:17 pm) ---------------------------------------------------------------------- Patient Info  ID #:       [id number]                          D.O.B.:  September 05, 1985 (33 yrs)  Name:       Emily CHRIS LAGUNA-  Visit Date: 12/24/2019 02:45 pm              [name] ---------------------------------------------------------------------- Performed By  Attending:         Ma RingsVictor Fang MD         Ref. Address:     [address]                                                             [address]  Performed By:     Eden Lathearrie Stalter BS      [facility]                    [facility] RVT                                 Fetal Care  [facility] ---------------------------------------------------------------------- Orders  #  Description                           Code        Ordered By  1  US MFM OB DETAIL +14 WK               76811.01    Jaynie CollinsUGONNA Shere Eisenhart  2  US MFM UA CORD DOPPLER                40981.1976820.02    Jaynie CollinsUGONNA Gracin Soohoo ----------------------------------------------------------------------  #  Order #                     Accession #                Episode #  1  [id number]                   [id number]                 [id number]  2  [id number]                   [id number]                 [id number] ---------------------------------------------------------------------- Indications  Maternal care for known or suspected poor      O36.5930  fetal growth, third trimester, not applicable or  unspecified IUGR  [redacted] weeks gestation of pregnancy                Z3A.30  Antenatal screening for malformations          Z36.3 ---------------------------------------------------------------------- Fetal Evaluation  Num Of Fetuses:         1  Fetal Heart Rate(bpm):  132  Cardiac Activity:       Observed  Presentation:           Cephalic  Placenta:               Posterior  P. Cord Insertion:      Visualized  Amniotic Fluid  AFI FV:      Within normal limits  AFI Sum(cm)     %Tile  20.9  82  RUQ(cm)       RLQ(cm)       LUQ(cm)        LLQ(cm)  4             6.7           5.7            4.5 ---------------------------------------------------------------------- Biometry  BPD:      74.5  mm     G. Age:  29w 6d         34  %    CI:        74.63   %    70 - 86                                                          FL/HC:      19.3   %    19.2 - 21.4  HC:      273.7   mm     G. Age:  29w 6d         14  %    HC/AC:      1.11        0.99 - 1.21  AC:      247.4  mm     G. Age:  29w 0d         17  %    FL/BPD:     71.0   %    71 - 87  FL:       52.9  mm     G. Age:  28w 1d        3.3  %    FL/AC:      21.4   %    20 - 24  HUM:      47.4  mm     G. Age:  27w 6d         10  %  CER:      34.8  mm     G. Age:  29w 6d         48  %  Est. FW:    1293  gm    2 lb 14 oz       9  % ---------------------------------------------------------------------- Gestational Age  LMP:           30w 0d        Date:  05/28/19                 EDD:   03/03/20  U/S Today:     29w 2d                                        EDD:   03/08/20  Best:          30w 0d     Det. By:  LMP  (05/28/19)          EDD:   03/03/20 ---------------------------------------------------------------------- Anatomy  Cranium:               Appears normal         LVOT:  Appears normal  Cavum:                 Appears normal         Aortic Arch:            Appears normal  Ventricles:            Appears normal         Ductal Arch:            Appears normal  Choroid Plexus:        Appears normal         Diaphragm:              Appears normal  Cerebellum:            Appears normal         Stomach:                Appears normal, left                                                                        sided  Posterior Fossa:       Appears normal         Abdomen:                Appears normal  Nuchal Fold:           Not applicable (>20    Abdominal Wall:         Not well visualized                         wks GA)  Face:                  Appears normal         Cord Vessels:           Appears normal (3                         (orbits and profile)                           vessel cord)  Lips:                  Appears normal         Kidneys:                Appear normal  Palate:                Appears normal         Bladder:                Appears normal  Thoracic:              Appears normal         Spine:                   Appears normal  Heart:                 Appears normal         Upper Extremities:  Appears normal                         (4CH, axis, and                         situs)  RVOT:                  Appears normal         Lower Extremities:      Appears normal  Other:  Heels/feet and hands visualized. Nasal bone visualized. ---------------------------------------------------------------------- Doppler - Fetal Vessels  Umbilical Artery   S/D     %tile      RI               PI                     ADFV    RDFV   2.73       44    0.63             0.95                        No      No ---------------------------------------------------------------------- Cervix Uterus Adnexa  Cervix  Not visualized (advanced GA >24wks) ---------------------------------------------------------------------- Comments  This patient was seen for a detailed ultrasound.  Based on  her prior ultrasound exams, the fetus has always measured  about 1 week behind her dates.  She denies any other  problems in her current pregnancy and reports feeling  vigorous fetal movements throughout the day.  On today's exam, the EFW measures at the 9th percentile for  her gestational age indicating fetal growth restriction.  There  was normal amniotic fluid noted.  Doppler studies of the umbilical arteries showed a normal  S/D ratio of 2.73.  There were no signs of absent or reversed  end-diastolic flow.  Due to fetal growth restriction, we will continue to follow her  with weekly fetal testing and umbilical artery Doppler studies.  The views of the fetal anatomy today were limited due to her  advanced gestational age.  However, there were no obvious  anomalies noted.  An umbilical artery Doppler study and biophysical profile was  scheduled in 1 week.  We will reassess the fetal growth again  in 3 weeks. ----------------------------------------------------------------------                   Ma Rings, MD Electronically Signed Final Report   12/24/2019 03:17 pm  ----------------------------------------------------------------------  [place] MFM UA CORD DOPPLER  Result Date: 12/31/2019 ----------------------------------------------------------------------  OBSTETRICS REPORT                       (Signed Final 12/31/2019 04:52 pm) ---------------------------------------------------------------------- Patient Info  ID #:       [id number]                          D.O.B.:  [birth year] (33 yrs)  Name:       Emily Seamen-             Visit Date: 12/31/2019 04:03 pm              [name] ---------------------------------------------------------------------- Performed By  Attending:        Ma Rings MD         Ref. Address:  [address]                                                             [address]  Performed By:     Lenise Arena        [facility]                                     Fetal Care  Referred By:      [facility] ---------------------------------------------------------------------- Orders  #  Description                           Code        Ordered By  1  [place] MFM FETAL BPP WO NON               76819.01    GARY FANG     STRESS  2  [place] MFM UA CORD DOPPLER                76820.02    GARY FANG ----------------------------------------------------------------------  #  Order #                     Accession #                Episode #  1  [id number]                   [id number]                 [id number]  2  [id number]                   [id number]                 [id number] ---------------------------------------------------------------------- Indications  Maternal care for known or suspected poor      O36.5930  fetal growth, third trimester, not applicable or  unspecified IUGR  Antenatal screening for malformations          Z36.3  [redacted] weeks gestation of pregnancy                Z3A.31 ---------------------------------------------------------------------- Vital Signs  Weight (lb): 176                                Height:        5'1"  BMI:         33.25 ---------------------------------------------------------------------- Fetal Evaluation  Num Of Fetuses:         1  Fetal Heart Rate(bpm):  133  Cardiac Activity:       Observed  Presentation:           Cephalic  Placenta:               Posterior  P. Cord Insertion:      Previously Visualized  Amniotic Fluid  AFI FV:      Within normal limits  AFI Sum(cm)     %Tile       Largest Pocket(cm)  16.1            58          4.6  RUQ(cm)       RLQ(cm)       LUQ(cm)        LLQ(cm)  4.6           4.3           4.5            2.7 ---------------------------------------------------------------------- Biophysical Evaluation  Amniotic F.V:   Within normal limits       F. Tone:        Observed  F. Movement:    Observed                   Score:          8/8  F. Breathing:   Observed ---------------------------------------------------------------------- Gestational Age  LMP:           31w 0d        Date:  05/28/19                 EDD:   03/03/20  Best:          Bobbye Riggs 0d     Det. By:  LMP  (05/28/19)          EDD:   03/03/20 ---------------------------------------------------------------------- Anatomy  Cranium:               Appears normal         LVOT:                   Appears normal  Cavum:                 Appears normal         Aortic Arch:            Appears normal  Ventricles:            Appears normal         Ductal Arch:            Appears normal  Choroid Plexus:        Appears normal         Diaphragm:              Appears normal  Cerebellum:            Appears normal         Stomach:                Appears normal, left                                                                        sided  Posterior Fossa:       Appears normal         Abdomen:                Appears normal  Nuchal Fold:           Not applicable (>20    Abdominal Wall:         Not well visualized  wks GA)  Face:                  Appears normal         Cord Vessels:           Appears normal  (3                         (orbits and profile)                           vessel cord)  Lips:                  Appears normal         Kidneys:                Appear normal  Palate:                Appears normal         Bladder:                Appears normal  Thoracic:              Appears normal         Spine:                  Appears normal  Heart:                 Appears normal         Upper Extremities:      Appears normal                         (4CH, axis, and                         situs)  RVOT:                  Appears normal         Lower Extremities:      Appears normal  Other:  Heels/feet and hands visualized. Nasal bone visualized. ---------------------------------------------------------------------- Doppler - Fetal Vessels  Umbilical Artery   S/D     %tile      RI    %tile                      PSV    ADFV    RDFV                                                     (cm/s)   2.63       42    0.62       45                     43.19      No      No ---------------------------------------------------------------------- Cervix Uterus Adnexa  Cervix  Not visualized (advanced GA >24wks) ---------------------------------------------------------------------- Comments  This patient was seen due to an IUGR fetus.  She denies any  problems since her last exam.  A biophysical profile performed today was 8 out of 8.  There was normal amniotic fluid noted on today's ultrasound  exam.  Doppler studies of  the umbilical arteries performed due to  fetal growth restriction showed a normal S/D ratio of 2.63.  There were no signs of absent or reversed end-diastolic flow  noted today.  A follow-up exam was scheduled in 1 week. ----------------------------------------------------------------------                   Ma Rings, MD Electronically Signed Final Report   12/31/2019 04:52 pm ----------------------------------------------------------------------  [place] MFM UA CORD DOPPLER  Result Date:  12/24/2019 ----------------------------------------------------------------------  OBSTETRICS REPORT                       (Signed Final 12/24/2019 03:17 pm) ---------------------------------------------------------------------- Patient Info  ID #:       [id number]                          D.O.B.:  11/26/1985 (33 yrs)  Name:       Emily Seamen-             Visit Date: 12/24/2019 02:45 pm              [name] ---------------------------------------------------------------------- Performed By  Attending:        Ma Rings MD         Ref. Address:     [address]                                                             [address]  Performed By:     Eden Lathe BS      Location[facility]                    RD[facility] RVT                                 Fetal Care  Referred By:      [facility] ---------------------------------------------------------------------- Orders  #  Description                           Code        Ordered By  1  [place] MFM OB DETAIL +14 WK               76811.01    Jaynie Collins  2  [place] MFM UA CORD DOPPLER                19147.82    Jaynie Collins ----------------------------------------------------------------------  #  Order #                     Accession #                Episode #  1  [id number]                   [id number]                 [id number]  2  [id number]                   [id number]  [id number] ---------------------------------------------------------------------- Indications  Maternal care for known or suspected poor      O36.5930  fetal growth, third trimester, not applicable or  unspecified IUGR  [redacted] weeks gestation of pregnancy                Z3A.30  Antenatal screening for malformations          Z36.3 ---------------------------------------------------------------------- Fetal Evaluation  Num Of Fetuses:         1  Fetal Heart Rate(bpm):  132  Cardiac Activity:       Observed  Presentation:           Cephalic  Placenta:                Posterior  P. Cord Insertion:      Visualized  Amniotic Fluid  AFI FV:      Within normal limits  AFI Sum(cm)     %Tile  20.9            82  RUQ(cm)       RLQ(cm)       LUQ(cm)        LLQ(cm)  4             6.7           5.7            4.5 ---------------------------------------------------------------------- Biometry  BPD:      74.5  mm     G. Age:  29w 6d         34  %    CI:        74.63   %    70 - 86                                                          FL/HC:      19.3   %    19.2 - 21.4  HC:      273.7  mm     G. Age:  29w 6d         14  %    HC/AC:      1.11        0.99 - 1.21  AC:      247.4  mm     G. Age:  29w 0d         17  %    FL/BPD:     71.0   %    71 - 87  FL:       52.9  mm     G. Age:  28w 1d        3.3  %    FL/AC:      21.4   %    20 - 24  HUM:      47.4  mm     G. Age:  27w 6d         10  %  CER:      34.8  mm     G. Age:  29w 6d         48  %  Est. FW:    1293  gm    2 lb 14 oz       9  % ---------------------------------------------------------------------- Gestational Age  LMP:  30w 0d        Date:  05/28/19                 EDD:   03/03/20  U/S Today:     29w 2d                                        EDD:   03/08/20  Best:          30w 0d     Det. By:  LMP  (05/28/19)          EDD:   03/03/20 ---------------------------------------------------------------------- Anatomy  Cranium:               Appears normal         LVOT:                   Appears normal  Cavum:                 Appears normal         Aortic Arch:            Appears normal  Ventricles:            Appears normal         Ductal Arch:            Appears normal  Choroid Plexus:        Appears normal         Diaphragm:              Appears normal  Cerebellum:            Appears normal         Stomach:                Appears normal, left                                                                        sided  Posterior Fossa:       Appears normal         Abdomen:                Appears normal  Nuchal Fold:            Not applicable (>20    Abdominal Wall:         Not well visualized                         wks GA)  Face:                  Appears normal         Cord Vessels:           Appears normal (3                         (orbits and profile)                           vessel cord)  Lips:  Appears normal         Kidneys:                Appear normal  Palate:                Appears normal         Bladder:                Appears normal  Thoracic:              Appears normal         Spine:                  Appears normal  Heart:                 Appears normal         Upper Extremities:      Appears normal                         (4CH, axis, and                         situs)  RVOT:                  Appears normal         Lower Extremities:      Appears normal  Other:  Heels/feet and hands visualized. Nasal bone visualized. ---------------------------------------------------------------------- Doppler - Fetal Vessels  Umbilical Artery   S/D     %tile      RI               PI                     ADFV    RDFV   2.73       44    0.63             0.95                        No      No ---------------------------------------------------------------------- Cervix Uterus Adnexa  Cervix  Not visualized (advanced GA >24wks) ---------------------------------------------------------------------- Comments  This patient was seen for a detailed ultrasound.  Based on  her prior ultrasound exams, the fetus has always measured  about 1 week behind her dates.  She denies any other  problems in her current pregnancy and reports feeling  vigorous fetal movements throughout the day.  On today's exam, the EFW measures at the 9th percentile for  her gestational age indicating fetal growth restriction.  There  was normal amniotic fluid noted.  Doppler studies of the umbilical arteries showed a normal  S/D ratio of 2.73.  There were no signs of absent or reversed  end-diastolic flow.  Due to fetal growth restriction, we will continue to  follow her  with weekly fetal testing and umbilical artery Doppler studies.  The views of the fetal anatomy today were limited due to her  advanced gestational age.  However, there were no obvious  anomalies noted.  An umbilical artery Doppler study and biophysical profile was  scheduled in 1 week.  We will reassess the fetal growth again  in 3 weeks. ----------------------------------------------------------------------                   Ma Rings, MD Electronically Signed Final Report   12/24/2019 03:17 pm ----------------------------------------------------------------------  Assessment and Plan:  Pregnancy: G3P2002 at [redacted]w[redacted]d 1. Poor fetal growth affecting management of mother in singleton pregnancy in third trimester Continue MFM scans weekly, will follow up recommendations about delivery.  2. [redacted] weeks gestation of pregnancy 3. Supervision of high risk pregnancy in third trimester Preterm labor symptoms and general obstetric precautions including but not limited to vaginal bleeding, contractions, leaking of fluid and fetal movement were reviewed in detail with the patient. Please refer to After Visit Summary for other counseling recommendations.   Return in about 2 weeks (around 01/18/2020) for OFFICE OB Visit.  Future Appointments  Date Time Provider Department Center  01/07/2020 11:00 AM [facility] NURSE [facility]  01/07/2020 11:00 AM [facility]1 [facility]  01/14/2020 10:15 AM [facility] NURSE [facility]  01/14/2020 10:15 AM [facility]2 [facility]    Jaynie Collins, MD

## 2020-01-07 ENCOUNTER — Ambulatory Visit: Payer: Self-pay | Admitting: *Deleted

## 2020-01-07 ENCOUNTER — Other Ambulatory Visit: Payer: Self-pay | Admitting: *Deleted

## 2020-01-07 ENCOUNTER — Other Ambulatory Visit: Payer: Self-pay

## 2020-01-07 ENCOUNTER — Ambulatory Visit: Payer: Self-pay | Attending: Obstetrics

## 2020-01-07 DIAGNOSIS — Z3A32 32 weeks gestation of pregnancy: Secondary | ICD-10-CM

## 2020-01-07 DIAGNOSIS — O365930 Maternal care for other known or suspected poor fetal growth, third trimester, not applicable or unspecified: Secondary | ICD-10-CM

## 2020-01-07 DIAGNOSIS — O365990 Maternal care for other known or suspected poor fetal growth, unspecified trimester, not applicable or unspecified: Secondary | ICD-10-CM | POA: Insufficient documentation

## 2020-01-07 DIAGNOSIS — O0993 Supervision of high risk pregnancy, unspecified, third trimester: Secondary | ICD-10-CM

## 2020-01-14 ENCOUNTER — Other Ambulatory Visit: Payer: Self-pay

## 2020-01-14 ENCOUNTER — Other Ambulatory Visit: Payer: Self-pay | Admitting: Pediatrics

## 2020-01-14 ENCOUNTER — Other Ambulatory Visit: Payer: Self-pay | Admitting: *Deleted

## 2020-01-14 ENCOUNTER — Ambulatory Visit: Payer: Self-pay | Attending: Obstetrics

## 2020-01-14 ENCOUNTER — Ambulatory Visit: Payer: Self-pay | Admitting: *Deleted

## 2020-01-14 DIAGNOSIS — O365930 Maternal care for other known or suspected poor fetal growth, third trimester, not applicable or unspecified: Secondary | ICD-10-CM

## 2020-01-14 DIAGNOSIS — O365990 Maternal care for other known or suspected poor fetal growth, unspecified trimester, not applicable or unspecified: Secondary | ICD-10-CM | POA: Insufficient documentation

## 2020-01-14 DIAGNOSIS — Z3A33 33 weeks gestation of pregnancy: Secondary | ICD-10-CM

## 2020-01-14 DIAGNOSIS — Z362 Encounter for other antenatal screening follow-up: Secondary | ICD-10-CM

## 2020-01-14 DIAGNOSIS — O0993 Supervision of high risk pregnancy, unspecified, third trimester: Secondary | ICD-10-CM

## 2020-01-18 ENCOUNTER — Ambulatory Visit (INDEPENDENT_AMBULATORY_CARE_PROVIDER_SITE_OTHER): Payer: Self-pay | Admitting: Family Medicine

## 2020-01-18 ENCOUNTER — Other Ambulatory Visit: Payer: Self-pay

## 2020-01-18 ENCOUNTER — Encounter: Payer: Self-pay | Admitting: Radiology

## 2020-01-18 VITALS — BP 108/71 | HR 86 | Wt 181.0 lb

## 2020-01-18 DIAGNOSIS — Z758 Other problems related to medical facilities and other health care: Secondary | ICD-10-CM

## 2020-01-18 DIAGNOSIS — O365930 Maternal care for other known or suspected poor fetal growth, third trimester, not applicable or unspecified: Secondary | ICD-10-CM

## 2020-01-18 DIAGNOSIS — Z3A33 33 weeks gestation of pregnancy: Secondary | ICD-10-CM

## 2020-01-18 DIAGNOSIS — O0993 Supervision of high risk pregnancy, unspecified, third trimester: Secondary | ICD-10-CM

## 2020-01-18 DIAGNOSIS — Z789 Other specified health status: Secondary | ICD-10-CM

## 2020-01-18 DIAGNOSIS — Z603 Acculturation difficulty: Secondary | ICD-10-CM

## 2020-01-18 NOTE — Progress Notes (Signed)
    PRENATAL VISIT NOTE  Subjective:  Emily [name] is a 34 y.o. G3P2002 at [redacted]w[redacted]d being seen today for ongoing prenatal care.  She is currently monitored for the following issues for this low-risk pregnancy and has Supervision of high-risk pregnancy; Language barrier; and Poor fetal growth affecting management of mother in singleton pregnancy in third trimester on their problem list.  Patient reports no complaints.  Contractions: Not present.  .  Movement: Present. Denies leaking of fluid.   The following portions of the patient's history were reviewed and updated as appropriate: allergies, current medications, past family history, past medical history, past social history, past surgical history and problem list.   Objective:   Vitals:   01/18/20 1406  BP: 108/71  Pulse: 86  Weight: 181 lb (82.1 kg)    Fetal Status: Fetal Heart Rate (bpm): 152 Fundal Height: 30 cm Movement: Present     General:  Alert, oriented and cooperative. Patient is in no acute distress.  Skin: Skin is warm and dry. No rash noted.   Cardiovascular: Normal heart rate noted  Respiratory: Normal respiratory effort, no problems with respiration noted  Abdomen: Soft, gravid, appropriate for gestational age.  Pain/Pressure: Absent     Pelvic: Cervical exam deferred        Extremities: Normal range of motion.  Edema: None  Mental Status: Normal mood and affect. Normal behavior. Normal judgment and thought content.   Assessment and Plan:  Pregnancy: G3P2002 at [redacted]w[redacted]d 1. Supervision of high risk pregnancy in third trimester Continue routine prenatal care.  2. Language barrier Spanish interpreter: Nohelia used   3. Poor fetal growth affecting management of mother in singleton pregnancy in third trimester In weekly testing with MFM Last growth at 12% with normal fluid and normal Dopplers  Preterm labor symptoms and general obstetric precautions including but not limited to vaginal bleeding,  contractions, leaking of fluid and fetal movement were reviewed in detail with the patient. Please refer to After Visit Summary for other counseling recommendations.   Return in 3 weeks (on 02/08/2020).  Future Appointments  Date Time Provider Department Center  01/21/2020  1:30 PM [facility] NURSE [facility]  01/21/2020  1:45 PM [facility] US5 [facility]  01/28/2020 10:15 AM [facility] NURSE [facility]  01/28/2020 10:30 AM [facility] US3 [facility]  02/04/2020 11:00 AM [facility]URSE [facility]  02/04/2020 11:15 AM [facility] US2 [facility]  02/09/2020  3:30 PM Anyanwu, Jethro Bastos, MD [facility]    Reva Bores, MD

## 2020-01-18 NOTE — Patient Instructions (Signed)
 Lactancia materna Breastfeeding  Decidir amamantar es una de las mejores elecciones que puede hacer por usted y su beb. Un cambio en las hormonas durante el embarazo hace que las mamas produzcan leche materna en las glndulas productoras de leche. Las hormonas impiden que la leche materna sea liberada antes del nacimiento del beb. Adems, impulsan el flujo de leche luego del nacimiento. Una vez que ha comenzado a amamantar, pensar en el beb, as como la succin o el llanto, pueden estimular la liberacin de leche de las glndulas productoras de leche. Los beneficios de amamantar Las investigaciones demuestran que la lactancia materna ofrece muchos beneficios de salud para bebs y madres. Adems, ofrece una forma gratuita y conveniente de alimentar al beb. Para el beb  La primera leche (calostro) ayuda a mejorar el funcionamiento del aparato digestivo del beb.  Las clulas especiales de la leche (anticuerpos) ayudan a combatir las infecciones en el beb.  Los bebs que se alimentan con leche materna tambin tienen menos probabilidades de tener asma, alergias, obesidad o diabetes de tipo 2. Adems, tienen menor riesgo de sufrir el sndrome de muerte sbita del lactante (SMSL).  Los nutrientes de la leche materna son mejores para satisfacer las necesidades del beb en comparacin con la leche maternizada.  La leche materna mejora el desarrollo cerebral del beb. Para usted  La lactancia materna favorece el desarrollo de un vnculo muy especial entre la madre y el beb.  Es conveniente. La leche materna es econmica y siempre est disponible a la temperatura correcta.  La lactancia materna ayuda a quemar caloras. Le ayuda a perder el peso ganado durante el embarazo.  Hace que el tero vuelva al tamao que tena antes del embarazo ms rpido. Adems, disminuye el sangrado (loquios) despus del parto.  La lactancia materna contribuye a reducir el riesgo de tener diabetes de tipo 2,  osteoporosis, artritis reumatoide, enfermedades cardiovasculares y cncer de mama, ovario, tero y endometrio en el futuro. Informacin bsica sobre la lactancia Comienzo de la lactancia  Encuentre un lugar cmodo para sentarse o acostarse, con un buen respaldo para el cuello y la espalda.  Coloque una almohada o una manta enrollada debajo del beb para acomodarlo a la altura de la mama (si est sentada). Las almohadas para amamantar se han diseado especialmente a fin de servir de apoyo para los brazos y el beb mientras amamanta.  Asegrese de que la barriga del beb (abdomen) est frente a la suya.  Masajee suavemente la mama. Con las yemas de los dedos, masajee los bordes exteriores de la mama hacia adentro, en direccin al pezn. Esto estimula el flujo de leche. Si la leche fluye lentamente, es posible que deba continuar con este movimiento durante la lactancia.  Sostenga la mama con 4 dedos por debajo y el pulgar por arriba del pezn (forme la letra "C" con la mano). Asegrese de que los dedos se encuentren lejos del pezn y de la boca del beb.  Empuje suavemente los labios del beb con el pezn o con el dedo.  Cuando la boca del beb se abra lo suficiente, acrquelo rpidamente a la mama e introduzca todo el pezn y la arola, tanto como sea posible, dentro de la boca del beb. La arola es la zona de color que rodea al pezn. ? Debe haber ms arola visible por arriba del labio superior del beb que por debajo del labio inferior. ? Los labios del beb deben estar abiertos y extendidos hacia afuera (evertidos) para asegurar   que el beb se prenda de forma adecuada y cmoda. ? La lengua del beb debe estar entre la enca inferior y la mama.  Asegrese de que la boca del beb est en la posicin correcta alrededor del pezn (prendido). Los labios del beb deben crear un sello sobre la mama y estar doblados hacia afuera (invertidos).  Es comn que el beb succione durante 2 a 3 minutos  para que comience el flujo de leche materna. Cmo debe prenderse Es muy importante que le ensee al beb cmo prenderse adecuadamente a la mama. Si el beb no se prende adecuadamente, puede causar dolor en los pezones, reducir la produccin de leche materna y hacer que el beb tenga un escaso aumento de peso. Adems, si el beb no se prende adecuadamente al pezn, puede tragar aire durante la alimentacin. Esto puede causarle molestias al beb. Hacer eructar al beb al cambiar de mama puede ayudarlo a liberar el aire. Sin embargo, ensearle al beb cmo prenderse a la mama adecuadamente es la mejor manera de evitar que se sienta molesto por tragar aire mientras se alimenta. Signos de que el beb se ha prendido adecuadamente al pezn  Tironea o succiona de modo silencioso, sin causarle dolor. Los labios del beb deben estar extendidos hacia afuera (evertidos).  Se escucha que traga cada 3 o 4 succiones una vez que la leche ha comenzado a fluir (despus de que se produzca el reflejo de eyeccin de la leche).  Hay movimientos musculares por arriba y por delante de sus odos al succionar. Signos de que el beb no se ha prendido adecuadamente al pezn  Hace ruidos de succin o de chasquido mientras se alimenta.  Siente dolor en los pezones. Si cree que el beb no se prendi correctamente, deslice el dedo en la comisura de la boca y colquelo entre las encas del beb para interrumpir la succin. Intente volver a comenzar a amamantar. Signos de lactancia materna exitosa Signos del beb  El beb disminuir gradualmente el nmero de succiones o dejar de succionar por completo.  El beb se quedar dormido.  El cuerpo del beb se relajar.  El beb retendr una pequea cantidad de leche en la boca.  El beb se desprender solo del pecho. Signos que presenta usted  Las mamas han aumentado la firmeza, el peso y el tamao 1 a 3 horas despus de amamantar.  Estn ms blandas inmediatamente despus  de amamantar.  Se producen un aumento del volumen de leche y un cambio en su consistencia y color hacia el quinto da de lactancia.  Los pezones no duelen, no estn agrietados ni sangran. Signos de que su beb recibe la cantidad de leche suficiente  Mojar por lo menos 1 o 2paales durante las primeras 24horas despus del nacimiento.  Mojar por lo menos 5 o 6paales cada 24horas durante la primera semana despus del nacimiento. La orina debe ser clara o de color amarillo plido a los 5das de vida.  Mojar entre 6 y 8paales cada 24horas a medida que el beb sigue creciendo y desarrollndose.  Defeca por lo menos 3 veces en 24 horas a los 5 das de vida. Las heces deben ser blandas y amarillentas.  Defeca por lo menos 3 veces en 24 horas a los 7 das de vida. Las heces deben ser grumosas y amarillentas.  No registra una prdida de peso mayor al 10% del peso al nacer durante los primeros 3 das de vida.  Aumenta de peso un promedio de 4   a 7onzas (113 a 198g) por semana despus de los 4 das de vida.  Aumenta de peso, diariamente, de manera uniforme a partir de los 5 das de vida, sin registrar prdida de peso despus de las 2semanas de vida. Despus de alimentarse, es posible que el beb regurgite una pequea cantidad de leche. Esto es normal. Frecuencia y duracin de la lactancia El amamantamiento frecuente la ayudar a producir ms leche y puede prevenir dolores en los pezones y las mamas extremadamente llenas (congestin mamaria). Alimente al beb cuando muestre signos de hambre o si siente la necesidad de reducir la congestin de las mamas. Esto se denomina "lactancia a demanda". Las seales de que el beb tiene hambre incluyen las siguientes:  Aumento del estado de alerta, actividad o inquietud.  Mueve la cabeza de un lado a otro.  Abre la boca cuando se le toca la mejilla o la comisura de la boca (reflejo de bsqueda).  Aumenta las vocalizaciones, tales como sonidos de  succin, se relame los labios, emite arrullos, suspiros o chirridos.  Mueve la mano hacia la boca y se chupa los dedos o las manos.  Est molesto o llora. Evite el uso del chupete en las primeras 4 a 6 semanas despus del nacimiento del beb. Despus de este perodo, podr usar un chupete. Las investigaciones demostraron que el uso del chupete durante el primer ao de vida del beb disminuye el riesgo de tener el sndrome de muerte sbita del lactante (SMSL). Permita que el nio se alimente en cada mama todo lo que desee. Cuando el beb se desprende o se queda dormido mientras se est alimentando de la primera mama, ofrzcale la segunda. Debido a que, con frecuencia, los recin nacidos estn somnolientos las primeras semanas de vida, es posible que deba despertar al beb para alimentarlo. Los horarios de lactancia varan de un beb a otro. Sin embargo, las siguientes reglas pueden servir como gua para ayudarla a garantizar que el beb se alimenta adecuadamente:  Se puede amamantar a los recin nacidos (bebs de 4 semanas o menos de vida) cada 1 a 3 horas.  No deben transcurrir ms de 3 horas durante el da o 5 horas durante la noche sin que se amamante a los recin nacidos.  Debe amamantar al beb un mnimo de 8 veces en un perodo de 24 horas. Extraccin de leche materna     La extraccin y el almacenamiento de la leche materna le permiten asegurarse de que el beb se alimente exclusivamente de su leche materna, aun en momentos en los que no puede amamantar. Esto tiene especial importancia si debe regresar al trabajo en el perodo en que an est amamantando o si no puede estar presente en los momentos en que el beb debe alimentarse. Su asesor en lactancia puede ayudarla a encontrar un mtodo de extraccin que funcione mejor para usted y orientarla sobre cunto tiempo es seguro almacenar leche materna. Cmo cuidar las mamas durante la lactancia Los pezones pueden secarse, agrietarse y doler  durante la lactancia. Las siguientes recomendaciones pueden ayudarla a mantener las mamas humectadas y sanas:  Evite usar jabn en los pezones.  Use un sostn de soporte diseado especialmente para la lactancia materna. Evite usar sostenes con aro o sostenes muy ajustados (sostenes deportivos).  Seque al aire sus pezones durante 3 a 4minutos despus de amamantar al beb.  Utilice solo apsitos de algodn en el sostn para absorber las prdidas de leche. La prdida de un poco de leche materna entre   las tomas es normal.  Utilice lanolina sobre los pezones luego de amamantar. La lanolina ayuda a mantener la humedad normal de la piel. La lanolina pura no es perjudicial (no es txica) para el beb. Adems, puede extraer manualmente algunas gotas de leche materna y masajear suavemente esa leche sobre los pezones para que la leche se seque al aire. Durante las primeras semanas despus del nacimiento, algunas mujeres experimentan congestin mamaria. La congestin mamaria puede hacer que sienta las mamas pesadas, calientes y sensibles al tacto. El pico de la congestin mamaria ocurre en el plazo de los 3 a 5 das despus del parto. Las siguientes recomendaciones pueden ayudarla a aliviar la congestin mamaria:  Vace por completo las mamas al amamantar o extraer leche. Puede aplicar calor hmedo en las mamas (en la ducha o con toallas hmedas para manos) antes de amamantar o extraer leche. Esto aumenta la circulacin y ayuda a que la leche fluya. Si el beb no vaca por completo las mamas cuando lo amamanta, extraiga la leche restante despus de que haya finalizado.  Aplique compresas de hielo sobre las mamas inmediatamente despus de amamantar o extraer leche, a menos que le resulte demasiado incmodo. Haga lo siguiente: ? Ponga el hielo en una bolsa plstica. ? Coloque una toalla entre la piel y la bolsa de hielo. ? Coloque el hielo durante 20minutos, 2 o 3veces por da.  Asegrese de que el beb  est prendido y se encuentre en la posicin correcta mientras lo alimenta. Si la congestin mamaria persiste luego de 48 horas o despus de seguir estas recomendaciones, comunquese con su mdico o un asesor en lactancia. Recomendaciones de salud general durante la lactancia  Consuma 3 comidas y 3 colaciones saludables todos los das. Las madres bien alimentadas que amamantan necesitan entre 450 y 500 caloras adicionales por da. Puede cumplir con este requisito al aumentar la cantidad de una dieta equilibrada que realice.  Beba suficiente agua para mantener la orina clara o de color amarillo plido.  Descanse con frecuencia, reljese y siga tomando sus vitaminas prenatales para prevenir la fatiga, el estrs y los niveles bajos de vitaminas y minerales en el cuerpo (deficiencias de nutrientes).  No consuma ningn producto que contenga nicotina o tabaco, como cigarrillos y cigarrillos electrnicos. El beb puede verse afectado por las sustancias qumicas de los cigarrillos que pasan a la leche materna y por la exposicin al humo ambiental del tabaco. Si necesita ayuda para dejar de fumar, consulte al mdico.  Evite el consumo de alcohol.  No consuma drogas ilegales o marihuana.  Antes de usar cualquier medicamento, hable con el mdico. Estos incluyen medicamentos recetados y de venta libre, como tambin vitaminas y suplementos a base de hierbas. Algunos medicamentos, que pueden ser perjudiciales para el beb, pueden pasar a travs de la leche materna.  Puede quedar embarazada durante la lactancia. Si se desea un mtodo anticonceptivo, consulte al mdico sobre cules son las opciones seguras durante la lactancia. Dnde encontrar ms informacin: Liga internacional La Leche: www.llli.org. Comunquese con un mdico si:  Siente que quiere dejar de amamantar o se siente frustrada con la lactancia.  Sus pezones estn agrietados o sangran.  Sus mamas estn irritadas, sensibles o  calientes.  Tiene los siguientes sntomas: ? Dolor en las mamas o en los pezones. ? Un rea hinchada en cualquiera de las mamas. ? Fiebre o escalofros. ? Nuseas o vmitos. ? Drenaje de otro lquido distinto de la leche materna desde los pezones.  Sus mamas no   se llenan antes de amamantar al beb para el quinto da despus del parto.  Se siente triste y deprimida.  El beb: ? Est demasiado somnoliento como para comer bien. ? Tiene problemas para dormir. ? Tiene ms de 1 semana de vida y moja menos de 6 paales en un periodo de 24 horas. ? No ha aumentado de peso a los 5 das de vida.  El beb defeca menos de 3 veces en 24 horas.  La piel del beb o las partes blancas de los ojos se vuelven amarillentas. Solicite ayuda de inmediato si:  El beb est muy cansado (letargo) y no se quiere despertar para comer.  Le sube la fiebre sin causa. Resumen  La lactancia materna ofrece muchos beneficios de salud para bebs y madres.  Intente amamantar a su beb cuando muestre signos tempranos de hambre.  Haga cosquillas o empuje suavemente los labios del beb con el dedo o el pezn para lograr que el beb abra la boca. Acerque el beb a la mama. Asegrese de que la mayor parte de la arola se encuentre dentro de la boca del beb. Ofrzcale una mama y haga eructar al beb antes de pasar a la otra.  Hable con su mdico o asesor en lactancia si tiene dudas o problemas con la lactancia. Esta informacin no tiene como fin reemplazar el consejo del mdico. Asegrese de hacerle al mdico cualquier pregunta que tenga. Document Revised: 09/25/2017 Document Reviewed: 10/21/2016 Elsevier Patient Education  2020 Elsevier Inc.  

## 2020-01-18 NOTE — Progress Notes (Signed)
Tdap

## 2020-01-21 ENCOUNTER — Ambulatory Visit: Payer: Self-pay | Admitting: *Deleted

## 2020-01-21 ENCOUNTER — Other Ambulatory Visit: Payer: Self-pay

## 2020-01-21 ENCOUNTER — Ambulatory Visit: Payer: Self-pay | Attending: Obstetrics and Gynecology

## 2020-01-21 DIAGNOSIS — O0993 Supervision of high risk pregnancy, unspecified, third trimester: Secondary | ICD-10-CM

## 2020-01-21 DIAGNOSIS — O365930 Maternal care for other known or suspected poor fetal growth, third trimester, not applicable or unspecified: Secondary | ICD-10-CM

## 2020-01-21 DIAGNOSIS — O365990 Maternal care for other known or suspected poor fetal growth, unspecified trimester, not applicable or unspecified: Secondary | ICD-10-CM | POA: Insufficient documentation

## 2020-01-21 DIAGNOSIS — Z3A34 34 weeks gestation of pregnancy: Secondary | ICD-10-CM

## 2020-01-28 ENCOUNTER — Ambulatory Visit: Payer: Self-pay | Attending: Obstetrics and Gynecology

## 2020-01-28 ENCOUNTER — Ambulatory Visit: Payer: Self-pay | Admitting: *Deleted

## 2020-01-28 ENCOUNTER — Other Ambulatory Visit: Payer: Self-pay

## 2020-01-28 DIAGNOSIS — Z3A35 35 weeks gestation of pregnancy: Secondary | ICD-10-CM

## 2020-01-28 DIAGNOSIS — O365930 Maternal care for other known or suspected poor fetal growth, third trimester, not applicable or unspecified: Secondary | ICD-10-CM

## 2020-01-28 DIAGNOSIS — O0993 Supervision of high risk pregnancy, unspecified, third trimester: Secondary | ICD-10-CM

## 2020-01-28 DIAGNOSIS — O365990 Maternal care for other known or suspected poor fetal growth, unspecified trimester, not applicable or unspecified: Secondary | ICD-10-CM | POA: Insufficient documentation

## 2020-01-31 ENCOUNTER — Other Ambulatory Visit: Payer: Self-pay | Admitting: Obstetrics

## 2020-01-31 DIAGNOSIS — O365990 Maternal care for other known or suspected poor fetal growth, unspecified trimester, not applicable or unspecified: Secondary | ICD-10-CM

## 2020-02-04 ENCOUNTER — Ambulatory Visit: Payer: Self-pay | Admitting: *Deleted

## 2020-02-04 ENCOUNTER — Other Ambulatory Visit: Payer: Self-pay

## 2020-02-04 ENCOUNTER — Ambulatory Visit: Payer: Self-pay | Attending: Obstetrics and Gynecology

## 2020-02-04 ENCOUNTER — Other Ambulatory Visit: Payer: Self-pay | Admitting: *Deleted

## 2020-02-04 DIAGNOSIS — Z3A36 36 weeks gestation of pregnancy: Secondary | ICD-10-CM

## 2020-02-04 DIAGNOSIS — O365930 Maternal care for other known or suspected poor fetal growth, third trimester, not applicable or unspecified: Secondary | ICD-10-CM

## 2020-02-04 DIAGNOSIS — O0993 Supervision of high risk pregnancy, unspecified, third trimester: Secondary | ICD-10-CM

## 2020-02-04 DIAGNOSIS — Z362 Encounter for other antenatal screening follow-up: Secondary | ICD-10-CM | POA: Insufficient documentation

## 2020-02-04 DIAGNOSIS — O365990 Maternal care for other known or suspected poor fetal growth, unspecified trimester, not applicable or unspecified: Secondary | ICD-10-CM | POA: Insufficient documentation

## 2020-02-09 ENCOUNTER — Ambulatory Visit (INDEPENDENT_AMBULATORY_CARE_PROVIDER_SITE_OTHER): Payer: Self-pay | Admitting: Obstetrics & Gynecology

## 2020-02-09 ENCOUNTER — Other Ambulatory Visit (HOSPITAL_COMMUNITY)
Admission: RE | Admit: 2020-02-09 | Discharge: 2020-02-09 | Disposition: A | Discharge status: Home / Self Care | Payer: Self-pay | Source: Ambulatory Visit | Attending: Obstetrics & Gynecology | Admitting: Obstetrics & Gynecology

## 2020-02-09 ENCOUNTER — Other Ambulatory Visit: Payer: Self-pay

## 2020-02-09 VITALS — BP 113/72 | HR 77 | Wt 181.4 lb

## 2020-02-09 DIAGNOSIS — Z3A36 36 weeks gestation of pregnancy: Secondary | ICD-10-CM

## 2020-02-09 DIAGNOSIS — O365930 Maternal care for other known or suspected poor fetal growth, third trimester, not applicable or unspecified: Secondary | ICD-10-CM

## 2020-02-09 DIAGNOSIS — O0993 Supervision of high risk pregnancy, unspecified, third trimester: Secondary | ICD-10-CM

## 2020-02-09 LAB — OB RESULTS CONSOLE GC/CHLAMYDIA: Gonorrhea: NEGATIVE

## 2020-02-09 NOTE — Patient Instructions (Signed)
Signos y sntomas del trabajo de parto Signs and Symptoms of Labor El trabajo de parto es el proceso natural del cuerpo por el cual se saca al beb, la placenta y el cordn umbilical del tero. Por lo general, el proceso del trabajo de parto comienza cuando el embarazo ha llegado a su trmino, entre 37 y 40 semanas de embarazo. Cmo sabr que estoy prxima a comenzar el trabajo de parto? A medida que el cuerpo se prepara para el trabajo de parto y el nacimiento del beb, puede notar los siguientes sntomas en las semanas y das anteriores al trabajo de parto propiamente dicho:  Un fuerte deseo de preparar su casa para recibir al nuevo beb. Esto se denomina anidacin. La anidacin puede ser un signo de que se est acercando el trabajo de parto, y puede ocurrir varias semanas antes del nacimiento. La anidacin puede implicar limpiar y organizar la casa.  Una pequea cantidad de mucosidad espesa y con sangre sale de la vagina (aparicin normal de sangre o prdida del tapn mucoso). Esto puede suceder ms de una semana antes de que comience el trabajo de parto, o puede ocurrir justo antes de que comience el trabajo de parto a medida que el cuello uterino comienza a ensancharse (dilatarse). En algunas mujeres, el tapn mucoso sale entero de una sola vez. En otras, pueden salir pequeas partes del tapn mucoso de forma gradual durante varios das.  El beb se mueve (desciende) a la parte inferior de la pelvis para ponerse en posicin para el nacimiento (aligeramiento). Cuando esto sucede, puede sentir ms presin en la vejiga y el hueso plvico, y menos presin en las costillas. Esto facilitar la respiracin. Tambin puede hacer que necesite orinar con ms frecuencia y que tenga problemas para hacer de vientre.  Tener "contracciones de prctica" (contracciones de Braxton Hicks) que ocurren a intervalos irregulares (espaciadas de modo desigual) con una diferencia de ms de 10 minutos. Esto tambin se  denomina trabajo de parto falso. Las contracciones de trabajo de parto falso son comunes luego del ejercicio o la actividad sexual, y se detendrn si cambia de posicin, descansa o toma lquidos. Estas contracciones son generalmente leves y no se tornan ms fuertes con el tiempo. Pueden sentirse como lo siguiente: ? Un dolor de espalda. ? Calambres leves, similares a los dolores menstruales. ? Tirantez o presin en el abdomen. Otros sntomas tempranos de que el trabajo de parto puede comenzar pronto incluyen:  Nuseas o prdida del apetito.  Diarrea.  Un repentino estallido de energa o sentirse muy cansada.  Cambios en el humor.  Problemas para dormir. Cmo sabr cuando ha comenzado el trabajo de parto? Los signos de que ha comenzado el trabajo de parto verdadero pueden incluir:  Contracciones a intervalos regulares (espaciadas de modo regular) que se incrementan en intensidad. Esto puede sentirse como presin o estrechamiento intenso en el abdomen, que se desplaza hacia la espalda. ? Las contracciones pueden sentirse tambin como dolor rtmico en la parte superior de los muslos y la espalda que va y viene a intervalos regulares. ? Para las madres primerizas, este cambio en intensidad de las contracciones ocurre generalmente a un ritmo ms gradual. ? Las mujeres que ya han sido madres pueden notar una progresin ms rpida de los cambios de las contracciones.  Una sensacin de presin en el rea vaginal.  Ruptura de la bolsa (ruptura de las membranas). Es cuando el saco de lquido que rodea al beb se rompe. Cuando esto sucede, notar que le   sale lquido de la vagina. Este puede ser claro o estar manchado de sangre. Generalmente el trabajo de parto comienza 24 horas despus de la ruptura de bolsa, pero puede tomar ms tiempo en comenzar. ? Algunas mujeres notan esto como un chorro de lquido. ? Otras notan que la ropa interior se moja repetidas veces. Siga estas indicaciones en su  casa:   Cuando comience el trabajo de parto o si rompe bolsa, llame al mdico o a la lnea de atencin de enfermera. Ellos, en funcin de su situacin, determinarn cundo debe ir a hacerse un examen.  Si entra en trabajo de parto temprano, es posible que pueda descansar y manejar los sntomas en su casa. Algunas estrategias para probar en su casa incluyen: ? Tcnicas de respiracin y relajacin. ? Tomar una ducha o un bao de inmersin tibios. ? Escuchar msica. ? Usar una almohadilla trmica en la espalda para aliviar el dolor. Si se le indica que use calor:  Coloque una toalla entre la piel y la fuente de calor.  Aplique el calor durante 20 a 30minutos.  Retire la fuente de calor si la piel se pone de color rojo brillante. Esto es muy importante si no puede sentir dolor, calor o fro. Puede correr un riesgo mayor de sufrir quemaduras. Solicite ayuda de inmediato si:  Tiene contracciones dolorosas y regulares cada 5 minutos o menos.  El trabajo de parto comienza antes de que se cumplan las 37 semanas de embarazo.  Tiene fiebre.  Siente un dolor de cabeza intenso que no se alivia.  Elimina cogulos de sangre de color rojo brillante por la vagina.  No siente que el beb se mueva.  Experimenta la aparicin repentina de: ? Dolor de cabeza intenso con problemas de la visin. ? Nuseas, vmitos o diarrea. ? Dolor en el pecho o falta de aire. Estos sntomas pueden indicar una emergencia. Si el mdico recomienda que vaya al hospital o al centro de nacimientos donde va a dar a luz, no conduzca usted misma. Pdale a otra persona que conduzca o llame a los servicios de emergencia (911 en los [place]) Resumen  El trabajo de parto es el proceso natural del cuerpo por el cual se saca al beb, la placenta y el cordn umbilical del tero.  Por lo general, el proceso del trabajo de parto comienza cuando el embarazo ha llegado a su trmino, entre 37 y 40 semanas de embarazo.  Cuando  comience el trabajo de parto o si rompe bolsa, llame al mdico o a la lnea de atencin de enfermera. Ellos, en funcin de su situacin, determinarn cundo debe ir a hacerse un examen. Esta informacin no tiene como fin reemplazar el consejo del mdico. Asegrese de hacerle al mdico cualquier pregunta que tenga. Document Revised: 03/31/2017 Document Reviewed: 03/26/2017 Elsevier Patient Education  2020 [employer]  

## 2020-02-09 NOTE — Progress Notes (Signed)
PRENATAL VISIT NOTE  Subjective:  Emily [name] is a 34 y.o. G3P2002 at [redacted]w[redacted]d being seen today for ongoing prenatal care. Patient is Spanish-speaking only, interpreter present for this encounter.  She is currently monitored for the following issues for this high-risk pregnancy and has Supervision of high-risk pregnancy; Language barrier; and Poor fetal growth affecting management of mother in singleton pregnancy in third trimester on their problem list.  Patient reports no complaints.  Contractions: Not present.  .  Movement: Present. Denies leaking of fluid.   The following portions of the patient's history were reviewed and updated as appropriate: allergies, current medications, past family history, past medical history, past social history, past surgical history and problem list.   Objective:   Vitals:   02/09/20 1551  BP: 113/72  Pulse: 77  Weight: 181 lb 6.4 oz (82.3 kg)    Fetal Status: Fetal Heart Rate (bpm): 152   Movement: Present     General:  Alert, oriented and cooperative. Patient is in no acute distress.  Skin: Skin is warm and dry. No rash noted.   Cardiovascular: Normal heart rate noted  Respiratory: Normal respiratory effort, no problems with respiration noted  Abdomen: Soft, gravid, appropriate for gestational age.  Pain/Pressure: Absent     Pelvic: Cultures done in the presence of a chaperone, cervical exam deferred        Extremities: Normal range of motion.  Edema: None  Mental Status: Normal mood and affect. Normal behavior. Normal judgment and thought content.   Imaging: [place] MFM FETAL BPP WO NON STRESS  Result Date: 02/04/2020 ----------------------------------------------------------------------  OBSTETRICS REPORT                    (Corrected Final 02/04/2020 06:07 pm) ---------------------------------------------------------------------- Patient Info  ID #:       [id number]                          D.O.B.:  06-19-1986 (33 yrs)  Name:        Emily [name] ---------------------------------------------------------------------- Performed By  Attending:        Ma Rings MD         Ref. Address:     [address]                                                             [address]  Performed By:     Tommie Raymond BS,       [facility], [facility]                                [facility]  Referred By:      [facility] Mila Merry ---------------------------------------------------------------------- Orders  #  Description                           Code        Ordered By  1  [place] MFM OB FOLLOW UP                   [phone]    YU FANG  2  [place] MFM FETAL BPP WO NON               76819.01    RAVI SHANKAR     STRESS  3  [place] MFM UA CORD DOPPLER                76820.02    RAVI [facility] ----------------------------------------------------------------------  #  Order #                     Accession #                Episode #  1  [id number]                   [id number]                 [id number]  2  [id number]                   [id number]                 [id number]  3  [id number]                   [id number]                 [id number] ---------------------------------------------------------------------- Indications  Maternal care for known or suspected poor      O36.5930  fetal growth, third trimester, not applicable or  unspecified IUGR  [redacted] weeks gestation of pregnancy                Z3A.36  Encounter for other antenatal screening        Z36.2  follow-up ---------------------------------------------------------------------- Vital Signs                                                 Height:        5'1" ---------------------------------------------------------------------- Fetal Evaluation  Num Of Fetuses:         1  Fetal Heart  Rate(bpm):  131  Cardiac Activity:       Observed  Presentation:           Cephalic  Placenta:               Posterior  P. Cord Insertion:      Previously Visualized  Amniotic Fluid  AFI FV:      Within normal limits  AFI Sum(cm)     %Tile  Largest Pocket(cm)  13.2            46          3.8  RUQ(cm)       RLQ(cm)       LUQ(cm)        LLQ(cm)  3.8           3.1           3.7            2.6 ---------------------------------------------------------------------- Biophysical Evaluation  Amniotic F.V:   Pocket => 2 cm             F. Tone:        Observed  F. Movement:    Observed                   Score:          8/8  F. Breathing:   Observed ---------------------------------------------------------------------- Biometry  BPD:      85.5  mm     G. Age:  34w 3d         17  %    CI:        77.43   %    70 - 86                                                          FL/HC:      20.4   %    20.1 - 22.1  HC:      307.6  mm     G. Age:  34w 2d        2.1  %    HC/AC:      0.94        0.93 - 1.11  AC:      327.9  mm     G. Age:  36w 5d         79  %    FL/BPD:     73.2   %    71 - 87  FL:       62.6  mm     G. Age:  32w 3d        < 1  %    FL/AC:      19.1   %    20 - 24  LV:        5.9  mm  Est. FW:    2598  gm    5 lb 12 oz      27  % ---------------------------------------------------------------------- Gestational Age  LMP:           36w 0d        Date:  05/28/19                 EDD:   03/03/20  U/S Today:     34w 3d                                        EDD:   03/14/20  Best:          36w 0d     Det. By:  LMP  (05/28/19)  EDD:   03/03/20 ---------------------------------------------------------------------- Anatomy  Cranium:               Previously seen        LVOT:                   Previously seen  Cavum:                 Previously seen        Aortic Arch:            Previously seen  Ventricles:            Appears normal         Ductal Arch:            Previously seen  Choroid Plexus:        Previously  seen        Diaphragm:              Appears normal  Cerebellum:            Previously seen        Stomach:                Appears normal, left                                                                        sided  Posterior Fossa:       Previously seen        Abdomen:                Previously seen  Nuchal Fold:           Not applicable (>20    Abdominal Wall:         Not well visualized                         wks GA)  Face:                  Profile previously     Cord Vessels:           Previously seen                         seen  Lips:                  Previously seen        Kidneys:                Appear normal  Palate:                Previously seen        Bladder:                Appears normal  Thoracic:              Previously seen        Spine:                  Previously seen  Heart:                 Appears normal  Upper Extremities:      Previously seen                         (4CH, axis, and                         situs)  RVOT:                  Previously seen        Lower Extremities:      Previously seen  Other:  Heels/feet and hands visualized. Nasal bone visualized. ---------------------------------------------------------------------- Doppler - Fetal Vessels  Umbilical Artery   S/D     %tile      RI    %tile                             ADFV    RDFV    2.7       69    0.63       75                                No      No ---------------------------------------------------------------------- Cervix Uterus Adnexa  Cervix  Not visualized (advanced GA >24wks)  Uterus  No abnormality visualized.  Right Ovary  Within normal limits. No adnexal mass visualized.  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  Cul De Sac  . No free fluid seen.  Adnexa  No abnormality visualized. ---------------------------------------------------------------------- Comments  This patient was seen for a follow up growth scan due to fetal  growth restriction noted during her prior ultrasound  exams.  She denies any problems since her last exam and reports  feeling vigorous fetal movements throughout the day.  On today's exam, the EFW measures at the 27th percentile  for her gestational age.  There was normal amniotic fluid  noted.  A biophysical profile performed today was 8 out of 8.  Doppler studies of the umbilical arteries showed a normal  S/D ratio of 2.7.  There were no signs of absent or reversed  end-diastolic flow.  As the overall EFW appears within normal limits today, we  will continue to see her for weekly fetal testing.  Delivery may be considered at around 39 weeks. ----------------------------------------------------------------------                        Ma Rings, MD Electronically Signed Corrected Final Report  02/04/2020 06:07 pm ----------------------------------------------------------------------  [place] MFM FETAL BPP WO NON STRESS  Result Date: 01/28/2020 ----------------------------------------------------------------------  OBSTETRICS REPORT                       (Signed Final 01/28/2020 11:46 am) ---------------------------------------------------------------------- Patient Info  ID #:       [id number]                          D.O.B.:  [birth year] (33 yrs)  Name:       Emily Seamen-             Visit Date: 01/28/2020 10:51 am              [name] ---------------------------------------------------------------------- Performed By  Attending:  Noralee Space MD        Ref. Address:     [address]                                                             [address]  Performed By:     Reinaldo Raddle            [facility]  Referred By:      [facility] ---------------------------------------------------------------------- Orders  #   Description                           Code        Ordered By  1  [place] MFM FETAL BPP WO NON               76819.01    RAVI SHANKAR     STRESS  2  [place] MFM UA CORD DOPPLER                76820.02    [facility] ----------------------------------------------------------------------  #  Order #                     Accession #                Episode #  1  [id number]                   [id number]                 [id number]  2  [id number]                   [id number]                 [id number] ---------------------------------------------------------------------- Indications  [redacted] weeks gestation of pregnancy                Z3A.35  Maternal care for known or suspected poor      O36.5930  fetal growth, third trimester, not applicable or  unspecified IUGR ---------------------------------------------------------------------- Vital Signs  Height:        5'1" ---------------------------------------------------------------------- Fetal Evaluation  Num Of Fetuses:         1  Fetal Heart Rate(bpm):  125  Cardiac Activity:       Observed  Presentation:           Cephalic  Placenta:               Posterior  P. Cord Insertion:      Previously Visualized  Amniotic Fluid  AFI FV:      Within normal limits  AFI Sum(cm)     %Tile       Largest Pocket(cm)  13.2            44          5.1  RUQ(cm)       RLQ(cm)       LUQ(cm)        LLQ(cm)  2.5           3.7           5.1            1.9 ---------------------------------------------------------------------- Biophysical Evaluation  Amniotic F.V:   Pocket => 2 cm             F. Tone:        Observed  F. Movement:    Observed                   Score:          8/8  F. Breathing:   Observed ---------------------------------------------------------------------- Gestational Age  LMP:           35w 0d        Date:  05/28/19                 EDD:   03/03/20  Best:          Consuello Closs 0d     Det. By:  LMP  (05/28/19)          EDD:   03/03/20  ---------------------------------------------------------------------- Anatomy  Ventricles:            Previously seen        Kidneys:                Appear normal  Diaphragm:             Appears normal         Bladder:                Appears normal  Stomach:               Appears normal, left                         sided ---------------------------------------------------------------------- Doppler - Fetal Vessels  Umbilical Artery   S/D     %tile      RI    %tile      PI    %tile   2.53       54    0.61       63    0.88       58 ---------------------------------------------------------------------- Cervix Uterus Adnexa  Cervix  Not visualized (advanced GA >24wks) ---------------------------------------------------------------------- Impression  Patient return for antenatal testing.  On ultrasound performed  2 weeks ago, the estimated fetal weight was at the 12th  percentile.  However the interval growth was suboptimal.  On today's  ultrasound, amniotic fluid is normal and good fetal  activity seen.Antenatal testing is reassuring. BPP 8/8.  Umbilical artery Doppler showed normal forward diastolic flow  .  I have reassured the patient of the findings. ---------------------------------------------------------------------- Recommendations  -Fetal growth assessment next week.  -Continue weekly BPP and UA Doppler studies till delivery. ----------------------------------------------------------------------                  Noralee Space, MD Electronically Signed Final Report   01/28/2020 11:46 am ----------------------------------------------------------------------  [place] MFM FETAL BPP WO NON STRESS  Result Date: 01/21/2020 ----------------------------------------------------------------------  OBSTETRICS REPORT                       (Signed Final 01/21/2020 02:59 pm) ---------------------------------------------------------------------- Patient Info  ID #:       [id number]                          D.O.B.:  [birth year] (33  yrs)  Name:       Emily Seamen-             Visit Date: 01/21/2020 01:47 pm              [name] ---------------------------------------------------------------------- Performed By  Attending:        Ma Rings MD         Ref. Address:     [address]                                                             [address]  Performed By:     Sandi Mealy        Location[facility]                                     Fetal Care  Referred By:      [facility] ---------------------------------------------------------------------- Orders  #  Description                           Code        Ordered By  1  [place] MFM FETAL BPP WO NON               76819.01    RAVI SHANKAR     STRESS  2  [place] MFM UA CORD DOPPLER                40981.19    RAVI [facility] ----------------------------------------------------------------------  #  Order #                     Accession #                Episode #  1  [id number]                   [id number]                 [id number]  2  [id number]  [id number]                 [id number] ---------------------------------------------------------------------- Indications  Maternal care for known or suspected poor      O36.5930  fetal growth, third trimester, not applicable or  unspecified IUGR  [redacted] weeks gestation of pregnancy                Z3A.34 ---------------------------------------------------------------------- Vital Signs                                                 Height:        5'1" ---------------------------------------------------------------------- Fetal Evaluation  Num Of Fetuses:         1  Fetal Heart Rate(bpm):  126  Cardiac Activity:       Observed  Presentation:           Cephalic  Placenta:               Posterior  P. Cord Insertion:      Previously Visualized  Amniotic Fluid  AFI FV:      Within normal limits  AFI Sum(cm)     %Tile       Largest Pocket(cm)  13              41          5.8  RUQ(cm)       RLQ(cm)        LUQ(cm)        LLQ(cm)  3.7           2.5           1              5.8 ---------------------------------------------------------------------- Biophysical Evaluation  Amniotic F.V:   Within normal limits       F. Tone:        Observed  F. Movement:    Observed                   Score:          8/8  F. Breathing:   Observed ---------------------------------------------------------------------- Gestational Age  LMP:           34w 0d        Date:  05/28/19                 EDD:   03/03/20  Best:          34w 0d     Det. By:  LMP  (05/28/19)          EDD:   03/03/20 ---------------------------------------------------------------------- Doppler - Fetal Vessels  Umbilical Artery   S/D     %tile                                              ADFV    RDFV   2.79       65                                                 No  No ---------------------------------------------------------------------- Comments  This patient was seen due to a borderline IUGR fetus.  She  denies any problems since her last exam.  A biophysical profile performed today was 8 out of 8.  There was normal amniotic fluid noted on today's ultrasound  exam.  Doppler studies of the umbilical arteries performed due to  fetal growth restriction showed a normal S/D ratio of 2.79.  There were no signs of absent or reversed end-diastolic flow  noted today.  A follow-up exam was scheduled in 1 week. ----------------------------------------------------------------------                   Ma Rings, MD Electronically Signed Final Report   01/21/2020 02:59 pm ----------------------------------------------------------------------  [place] MFM OB FOLLOW UP  Result Date: 02/04/2020 ----------------------------------------------------------------------  OBSTETRICS REPORT                    (Corrected Final 02/04/2020 06:07 pm) ---------------------------------------------------------------------- Patient Info  ID #:       [id number]                          D.O.B.:   1985-08-29 (33 yrs)  Name:       Emily [name] ---------------------------------------------------------------------- Performed By  Attending:        Ma Rings MD         Ref. Address:     [address]                                                             [address]  Performed By:     Tommie Raymond BS,       [facility]                    RD[facility], RVT                                [facility]  Referred By:      [facility] ---------------------------------------------------------------------- Orders  #  Description                           Code        Ordered By  1  US MFM OB FOLLOW UP                   [id number]    YU FANG  2  US MFM FETAL BPP WO NON               76819.01    RAVI SHANKAR     STRESS  3  US MFM UA CORD DOPPLER                76820.02    RAVI SHANKAR ----------------------------------------------------------------------  #  Order #                     Accession #                Episode #  1  [id number]                   [id number]                 [id number]  2  [id number]                   [id number]  3  [id number]                   [id number]                 [id number] ---------------------------------------------------------------------- Indications  Maternal care for known or suspected poor      O36.5930  fetal growth, third trimester, not applicable or  unspecified IUGR  [redacted] weeks gestation of pregnancy                Z3A.36  Encounter for other antenatal screening        Z36.2  follow-up ---------------------------------------------------------------------- Vital Signs                                                 Height:        5'1" ---------------------------------------------------------------------- Fetal Evaluation  Num Of  Fetuses:         1  Fetal Heart Rate(bpm):  131  Cardiac Activity:       Observed  Presentation:           Cephalic  Placenta:               Posterior  P. Cord Insertion:      Previously Visualized  Amniotic Fluid  AFI FV:      Within normal limits  AFI Sum(cm)     %Tile       Largest Pocket(cm)  13.2            46          3.8  RUQ(cm)       RLQ(cm)       LUQ(cm)        LLQ(cm)  3.8           3.1           3.7            2.6 ---------------------------------------------------------------------- Biophysical Evaluation  Amniotic F.V:   Pocket => 2 cm             F. Tone:  Observed  F. Movement:    Observed                   Score:          8/8  F. Breathing:   Observed ---------------------------------------------------------------------- Biometry  BPD:      85.5  mm     G. Age:  34w 3d         17  %    CI:        77.43   %    70 - 86                                                          FL/HC:      20.4   %    20.1 - 22.1  HC:      307.6  mm     G. Age:  34w 2d        2.1  %    HC/AC:      0.94        0.93 - 1.11  AC:      327.9  mm     G. Age:  36w 5d         79  %    FL/BPD:     73.2   %    71 - 87  FL:       62.6  mm     G. Age:  32w 3d        < 1  %    FL/AC:      19.1   %    20 - 24  LV:        5.9  mm  Est. FW:    2598  gm    5 lb 12 oz      27  % ---------------------------------------------------------------------- Gestational Age  LMP:           36w 0d        Date:  05/28/19                 EDD:   03/03/20  U/S Today:     34w 3d                                        EDD:   03/14/20  Best:          36w 0d     Det. By:  LMP  (05/28/19)          EDD:   03/03/20 ---------------------------------------------------------------------- Anatomy  Cranium:               Previously seen        LVOT:                   Previously seen  Cavum:                 Previously seen        Aortic Arch:            Previously seen  Ventricles:            Appears normal  Ductal Arch:            Previously seen   Choroid Plexus:        Previously seen        Diaphragm:              Appears normal  Cerebellum:            Previously seen        Stomach:                Appears normal, left                                                                        sided  Posterior Fossa:       Previously seen        Abdomen:                Previously seen  Nuchal Fold:           Not applicable (>20    Abdominal Wall:         Not well visualized                         wks GA)  Face:                  Profile previously     Cord Vessels:           Previously seen                         seen  Lips:                  Previously seen        Kidneys:                Appear normal  Palate:                Previously seen        Bladder:                Appears normal  Thoracic:              Previously seen        Spine:                  Previously seen  Heart:                 Appears normal         Upper Extremities:      Previously seen                         (4CH, axis, and                         situs)  RVOT:                  Previously seen        Lower Extremities:      Previously seen  Other:  Heels/feet and hands visualized. Nasal bone visualized. ---------------------------------------------------------------------- Doppler - Fetal Vessels  Umbilical Artery  S/D     %tile      RI    %tile                             ADFV    RDFV    2.7       69    0.63       75                                No      No ---------------------------------------------------------------------- Cervix Uterus Adnexa  Cervix  Not visualized (advanced GA >24wks)  Uterus  No abnormality visualized.  Right Ovary  Within normal limits. No adnexal mass visualized.  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  Cul De Sac  . No free fluid seen.  Adnexa  No abnormality visualized. ---------------------------------------------------------------------- Comments  This patient was seen for a follow up growth scan due to fetal  growth restriction  noted during her prior ultrasound exams.  She denies any problems since her last exam and reports  feeling vigorous fetal movements throughout the day.  On today's exam, the EFW measures at the 27th percentile  for her gestational age.  There was normal amniotic fluid  noted.  A biophysical profile performed today was 8 out of 8.  Doppler studies of the umbilical arteries showed a normal  S/D ratio of 2.7.  There were no signs of absent or reversed  end-diastolic flow.  As the overall EFW appears within normal limits today, we  will continue to see her for weekly fetal testing.  Delivery may be considered at around 39 weeks. ----------------------------------------------------------------------                        Ma Rings, MD Electronically Signed Corrected Final Report  02/04/2020 06:07 pm ----------------------------------------------------------------------  [place] MFM UA CORD DOPPLER  Result Date: 02/04/2020 ----------------------------------------------------------------------  OBSTETRICS REPORT                    (Corrected Final 02/04/2020 06:07 pm) ---------------------------------------------------------------------- Patient Info  ID #:       [id number]                          D.O.B.:  [birth year] (33 yrs)  Name:       Emily [name] ---------------------------------------------------------------------- Performed By  Attending:        Ma Rings MD         Ref. Address:     [address]                                                             [address]  Performed By:     Tommie Raymond BS,       [facility]                                [facility]  Referred By:      [facility]  ---------------------------------------------------------------------- Orders  #  Description                           Code        Ordered By  1  [place] MFM OB FOLLOW UP                   [phone]    YU FANG  2  [place] MFM FETAL BPP WO NON               76819.01    RAVI SHANKAR     STRESS  3  [place] MFM UA CORD DOPPLER                76820.02    RAVI [facility] ----------------------------------------------------------------------  #  Order #                     Accession #                Episode #  1  [id number]                   [id number]                 [id number]  2  [id number]                   [id number]                 [id number]  3  [id number]                   [id number]                 [id number] ---------------------------------------------------------------------- Indications  Maternal care for known or suspected poor      O36.5930  fetal growth, third trimester, not applicable or  unspecified IUGR  [redacted] weeks gestation of pregnancy                Z3A.36  Encounter for other antenatal screening        Z36.2  follow-up ---------------------------------------------------------------------- Vital Signs                                                 Height:        5'1" ---------------------------------------------------------------------- Fetal  Evaluation  Num Of Fetuses:         1  Fetal Heart Rate(bpm):  131  Cardiac Activity:       Observed  Presentation:           Cephalic  Placenta:               Posterior  P. Cord Insertion:      Previously Visualized  Amniotic Fluid  AFI FV:      Within normal limits  AFI Sum(cm)     %Tile       Largest Pocket(cm)  13.2            46          3.8  RUQ(cm)       RLQ(cm)       LUQ(cm)        LLQ(cm)  3.8           3.1           3.7            2.6 ---------------------------------------------------------------------- Biophysical Evaluation  Amniotic F.V:   Pocket => 2 cm             F. Tone:        Observed  F. Movement:    Observed                   Score:          8/8  F. Breathing:    Observed ---------------------------------------------------------------------- Biometry  BPD:      85.5  mm     G. Age:  34w 3d         17  %    CI:        77.43   %    70 - 86                                                          FL/HC:      20.4   %    20.1 - 22.1  HC:      307.6  mm     G. Age:  34w 2d        2.1  %    HC/AC:      0.94        0.93 - 1.11  AC:      327.9  mm     G. Age:  36w 5d         79  %    FL/BPD:     73.2   %    71 - 87  FL:       62.6  mm     G. Age:  32w 3d        < 1  %    FL/AC:      19.1   %    20 - 24  LV:        5.9  mm  Est. FW:    2598  gm    5 lb 12 oz      27  % ---------------------------------------------------------------------- Gestational Age  LMP:           36w 0d        Date:  05/28/19  EDD:   03/03/20  U/S Today:     34w 3d                                        EDD:   03/14/20  Best:          36w 0d     Det. By:  LMP  (05/28/19)          EDD:   03/03/20 ---------------------------------------------------------------------- Anatomy  Cranium:               Previously seen        LVOT:                   Previously seen  Cavum:                 Previously seen        Aortic Arch:            Previously seen  Ventricles:            Appears normal         Ductal Arch:            Previously seen  Choroid Plexus:        Previously seen        Diaphragm:              Appears normal  Cerebellum:            Previously seen        Stomach:                Appears normal, left                                                                        sided  Posterior Fossa:       Previously seen        Abdomen:                Previously seen  Nuchal Fold:           Not applicable (>20    Abdominal Wall:         Not well visualized                         wks GA)  Face:                  Profile previously     Cord Vessels:           Previously seen                         seen  Lips:                  Previously seen        Kidneys:                Appear normal  Palate:                 Previously seen        Bladder:  Appears normal  Thoracic:              Previously seen        Spine:                  Previously seen  Heart:                 Appears normal         Upper Extremities:      Previously seen                         (4CH, axis, and                         situs)  RVOT:                  Previously seen        Lower Extremities:      Previously seen  Other:  Heels/feet and hands visualized. Nasal bone visualized. ---------------------------------------------------------------------- Doppler - Fetal Vessels  Umbilical Artery   S/D     %tile      RI    %tile                             ADFV    RDFV    2.7       69    0.63       75                                No      No ---------------------------------------------------------------------- Cervix Uterus Adnexa  Cervix  Not visualized (advanced GA >24wks)  Uterus  No abnormality visualized.  Right Ovary  Within normal limits. No adnexal mass visualized.  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  Cul De Sac  . No free fluid seen.  Adnexa  No abnormality visualized. ---------------------------------------------------------------------- Comments  This patient was seen for a follow up growth scan due to fetal  growth restriction noted during her prior ultrasound exams.  She denies any problems since her last exam and reports  feeling vigorous fetal movements throughout the day.  On today's exam, the EFW measures at the 27th percentile  for her gestational age.  There was normal amniotic fluid  noted.  A biophysical profile performed today was 8 out of 8.  Doppler studies of the umbilical arteries showed a normal  S/D ratio of 2.7.  There were no signs of absent or reversed  end-diastolic flow.  As the overall EFW appears within normal limits today, we  will continue to see her for weekly fetal testing.  Delivery may be considered at around 39 weeks.  ----------------------------------------------------------------------                        Ma Rings, MD Electronically Signed Corrected Final Report  02/04/2020 06:07 pm ----------------------------------------------------------------------  [place] MFM UA CORD DOPPLER  Result Date: 01/28/2020 ----------------------------------------------------------------------  OBSTETRICS REPORT                       (Signed Final 01/28/2020 11:46 am) ---------------------------------------------------------------------- Patient Info  ID #:       [id number]  D.O.B.:  01/09/1986 (33 yrs)  Name:       Emily SINGLETON-             Visit Date: 01/28/2020 10:51 am              [name] ---------------------------------------------------------------------- Performed By  Attending:        Noralee Space MD        Ref. Address:     [address]                                                             [address]  Performed By:     Reinaldo Raddle            Location[facility]         [facility]  Referred By:      [facility] ---------------------------------------------------------------------- Orders  #  Description                           Code        Ordered By  1  [place] MFM FETAL BPP WO NON               76819.01    RAVI SHANKAR     STRESS  2  [place] MFM UA CORD DOPPLER                16109.60    RAVI [facility] ----------------------------------------------------------------------  #  Order #                     Accession #                Episode #  1  [id number]                   [id number]                 [id number]  2  [id number]                   [id number]                 [id number] ---------------------------------------------------------------------- Indications  [redacted] weeks gestation of pregnancy                 Z3A.35  Maternal care for known or suspected poor      O36.5930  fetal growth, third trimester, not applicable or  unspecified IUGR ---------------------------------------------------------------------- Vital Signs                                                 Height:        5'1" ---------------------------------------------------------------------- Fetal Evaluation  Num Of Fetuses:         1  Fetal Heart Rate(bpm):  125  Cardiac Activity:       Observed  Presentation:           Cephalic  Placenta:               Posterior  P. Cord Insertion:      Previously Visualized  Amniotic Fluid  AFI FV:      Within normal limits  AFI Sum(cm)     %Tile       Largest Pocket(cm)  13.2            44          5.1  RUQ(cm)       RLQ(cm)       LUQ(cm)        LLQ(cm)  2.5           3.7           5.1            1.9 ---------------------------------------------------------------------- Biophysical Evaluation  Amniotic F.V:   Pocket => 2 cm             F. Tone:        Observed  F. Movement:    Observed                   Score:          8/8  F. Breathing:   Observed ---------------------------------------------------------------------- Gestational Age  LMP:           35w 0d        Date:  05/28/19                 EDD:   03/03/20  Best:          Consuello Closs 0d     Det. By:  LMP  (05/28/19)          EDD:   03/03/20 ---------------------------------------------------------------------- Anatomy  Ventricles:            Previously seen        Kidneys:                Appear normal  Diaphragm:             Appears normal         Bladder:                Appears normal  Stomach:               Appears normal, left                         sided ---------------------------------------------------------------------- Doppler - Fetal Vessels  Umbilical Artery   S/D     %tile  RI    %tile      PI    %tile   2.53       54    0.61       63    0.88       58 ---------------------------------------------------------------------- Cervix Uterus Adnexa  Cervix   Not visualized (advanced GA >24wks) ---------------------------------------------------------------------- Impression  Patient return for antenatal testing.  On ultrasound performed  2 weeks ago, the estimated fetal weight was at the 12th  percentile.  However the interval growth was suboptimal.  On today's ultrasound, amniotic fluid is normal and good fetal  activity seen.Antenatal testing is reassuring. BPP 8/8.  Umbilical artery Doppler showed normal forward diastolic flow  .  I have reassured the patient of the findings. ---------------------------------------------------------------------- Recommendations  -Fetal growth assessment next week.  -Continue weekly BPP and UA Doppler studies till delivery. ----------------------------------------------------------------------                  Noralee Space, MD Electronically Signed Final Report   01/28/2020 11:46 am ----------------------------------------------------------------------  [place] MFM UA CORD DOPPLER  Result Date: 01/21/2020 ----------------------------------------------------------------------  OBSTETRICS REPORT                       (Signed Final 01/21/2020 02:59 pm) ---------------------------------------------------------------------- Patient Info  ID #:       [id number]                          D.O.B.:  1986-07-05 (33 yrs)  Name:       Emily Seamen-             Visit Date: 01/21/2020 01:47 pm              [name] ---------------------------------------------------------------------- Performed By  Attending:        Ma Rings MD         Ref. Address:     [address]                                                             [address]  Performed By:     Sandi Mealy        [facility]  Referred By:      [facility] Mila Merry ---------------------------------------------------------------------- Orders  #  Description                           Code         Ordered By  1  [place] MFM FETAL BPP WO NON               76819.01    RAVI SHANKAR     STRESS  2  [place] MFM UA CORD DOPPLER                40981.19  Daleen Bo [facility] ----------------------------------------------------------------------  #  Order #                     Accession #                Episode #  1  [id number]                   [id number]                 [id number]  2  [id number]                   [id number]                 [id number] ---------------------------------------------------------------------- Indications  Maternal care for known or suspected poor      O36.5930  fetal growth, third trimester, not applicable or  unspecified IUGR  [redacted] weeks gestation of pregnancy                Z3A.34 ---------------------------------------------------------------------- Vital Signs                                                 Height:        5'1" ---------------------------------------------------------------------- Fetal Evaluation  Num Of Fetuses:         1  Fetal Heart Rate(bpm):  126  Cardiac Activity:       Observed  Presentation:           Cephalic  Placenta:               Posterior  P. Cord Insertion:      Previously Visualized  Amniotic Fluid  AFI FV:      Within normal limits  AFI Sum(cm)     %Tile       Largest Pocket(cm)  13              41          5.8  RUQ(cm)       RLQ(cm)       LUQ(cm)        LLQ(cm)  3.7           2.5           1              5.8 ---------------------------------------------------------------------- Biophysical Evaluation  Amniotic F.V:   Within normal limits       F. Tone:        Observed  F. Movement:    Observed                   Score:          8/8  F. Breathing:   Observed ---------------------------------------------------------------------- Gestational Age  LMP:           34w 0d        Date:  05/28/19                 EDD:   03/03/20  Best:          34w 0d     Det. By:  LMP  (05/28/19)          EDD:   03/03/20 ---------------------------------------------------------------------- Doppler  - Fetal Vessels  Umbilical Artery   S/D     %tile  ADFV    RDFV   2.79       65                                                 No      No ---------------------------------------------------------------------- Comments  This patient was seen due to a borderline IUGR fetus.  She  denies any problems since her last exam.  A biophysical profile performed today was 8 out of 8.  There was normal amniotic fluid noted on today's ultrasound  exam.  Doppler studies of the umbilical arteries performed due to  fetal growth restriction showed a normal S/D ratio of 2.79.  There were no signs of absent or reversed end-diastolic flow  noted today.  A follow-up exam was scheduled in 1 week. ----------------------------------------------------------------------                   Ma Rings, MD Electronically Signed Final Report   01/21/2020 02:59 pm ----------------------------------------------------------------------   Assessment and Plan:  Pregnancy: O9G2952 at [redacted]w[redacted]d 1. Poor fetal growth affecting management of mother in singleton pregnancy in third trimester EFW 27% recently. Recommendations was to continue testing as per MFM. Also recommended considering IOL at 39 weeks. Patient does not want to be induced until 40 weeks, I told her it was reasonable as long as testing remains stable and reassuring.  2. [redacted] weeks gestation of pregnancy 3. Supervision of high risk pregnancy in third trimester Pelvic cultures done. - Strep Gp B NAA - GC/Chlamydia probe amp ([place])not at [facility] Preterm labor symptoms and general obstetric precautions including but not limited to vaginal bleeding, contractions, leaking of fluid and fetal movement were reviewed in detail with the patient. Please refer to After Visit Summary for other counseling recommendations.   Return in about 1 week (around 02/16/2020) for OFFICE OB Visit.  Future Appointments  Date Time Provider Department Center    02/11/2020  7:30 AM [facility] NURSE [facility]  02/11/2020  7:45 AM [facility] US[facility]  02/18/2020  7:30 AM [facility] NURSE [facility]  02/18/2020  7:45 AM [facility]4 [facility]    Jaynie Collins, MD

## 2020-02-11 ENCOUNTER — Other Ambulatory Visit: Payer: Self-pay

## 2020-02-11 ENCOUNTER — Ambulatory Visit: Payer: Self-pay | Attending: Obstetrics and Gynecology

## 2020-02-11 ENCOUNTER — Ambulatory Visit: Payer: Self-pay | Admitting: *Deleted

## 2020-02-11 VITALS — BP 117/66 | HR 77

## 2020-02-11 DIAGNOSIS — Z3A37 37 weeks gestation of pregnancy: Secondary | ICD-10-CM

## 2020-02-11 DIAGNOSIS — O365930 Maternal care for other known or suspected poor fetal growth, third trimester, not applicable or unspecified: Secondary | ICD-10-CM

## 2020-02-11 DIAGNOSIS — O0993 Supervision of high risk pregnancy, unspecified, third trimester: Secondary | ICD-10-CM

## 2020-02-11 DIAGNOSIS — O365990 Maternal care for other known or suspected poor fetal growth, unspecified trimester, not applicable or unspecified: Secondary | ICD-10-CM | POA: Insufficient documentation

## 2020-02-11 DIAGNOSIS — Z364 Encounter for antenatal screening for fetal growth retardation: Secondary | ICD-10-CM

## 2020-02-11 LAB — STREP GP B NAA: Strep Gp B NAA: NEGATIVE

## 2020-02-11 LAB — GC/CHLAMYDIA PROBE AMP (~~LOC~~) NOT AT ARMC
Chlamydia: NEGATIVE
Comment: NEGATIVE
Comment: NORMAL
Neisseria Gonorrhea: NEGATIVE

## 2020-02-15 ENCOUNTER — Encounter: Payer: Self-pay | Admitting: Radiology

## 2020-02-15 ENCOUNTER — Other Ambulatory Visit: Payer: Self-pay

## 2020-02-15 ENCOUNTER — Ambulatory Visit (INDEPENDENT_AMBULATORY_CARE_PROVIDER_SITE_OTHER): Payer: Self-pay | Admitting: Obstetrics and Gynecology

## 2020-02-15 ENCOUNTER — Telehealth (HOSPITAL_COMMUNITY): Payer: Self-pay | Admitting: *Deleted

## 2020-02-15 VITALS — BP 110/73 | HR 72 | Wt 182.8 lb

## 2020-02-15 DIAGNOSIS — O0993 Supervision of high risk pregnancy, unspecified, third trimester: Secondary | ICD-10-CM

## 2020-02-15 DIAGNOSIS — Z789 Other specified health status: Secondary | ICD-10-CM

## 2020-02-15 DIAGNOSIS — O365930 Maternal care for other known or suspected poor fetal growth, third trimester, not applicable or unspecified: Secondary | ICD-10-CM

## 2020-02-15 NOTE — Progress Notes (Signed)
Prenatal Visit Note Date: 02/15/2020 Clinic: [facility]  Subjective:  Emily [name] is a 34 y.o. E2A8341 at [redacted]w[redacted]d being seen today for ongoing prenatal care.  She is currently monitored for the following issues for this high-risk pregnancy and has Supervision of high-risk pregnancy; Language barrier; and Poor fetal growth affecting management of mother in singleton pregnancy in third trimester on their problem list.  Patient reports no complaints.   Contractions: Not present.  .  Movement: Present. Denies leaking of fluid.   The following portions of the patient's history were reviewed and updated as appropriate: allergies, current medications, past family history, past medical history, past social history, past surgical history and problem list. Problem list updated.  Objective:   Vitals:   02/15/20 1020  BP: 110/73  Pulse: 72  Weight: 182 lb 12.8 oz (82.9 kg)    Fetal Status: Fetal Heart Rate (bpm): 153 Fundal Height: 37 cm Movement: Present  Presentation: Vertex  General:  Alert, oriented and cooperative. Patient is in no acute distress.  Skin: Skin is warm and dry. No rash noted.   Cardiovascular: Normal heart rate noted  Respiratory: Normal respiratory effort, no problems with respiration noted  Abdomen: Soft, gravid, appropriate for gestational age. Pain/Pressure: Absent     Pelvic:  Cervical exam performed Dilation: 1 Effacement (%): Thick Station: Ballotable  Extremities: Normal range of motion.  Edema: None  Mental Status: Normal mood and affect. Normal behavior. Normal judgment and thought content.   Urinalysis:      Assessment and Plan:  Pregnancy: G3P2002 at [redacted]w[redacted]d  1. Language barrier Interpreter used  2. Supervision of high risk pregnancy in third trimester Routine care  3. Poor fetal growth affecting management of mother in singleton pregnancy in third trimester borderline, mfm continuing with weekly dopplers, bpp. Set up  for 8/18 IOL   Preterm labor symptoms and general obstetric precautions including but not limited to vaginal bleeding, contractions, leaking of fluid and fetal movement were reviewed in detail with the patient. Please refer to After Visit Summary for other counseling recommendations.  Return in about 1 week (around 02/22/2020) for high risk, in person.   [place] Bing, MD

## 2020-02-15 NOTE — Telephone Encounter (Signed)
Preadmission screen Interpreter number [phone]

## 2020-02-16 ENCOUNTER — Telehealth (HOSPITAL_COMMUNITY): Payer: Self-pay | Admitting: *Deleted

## 2020-02-16 ENCOUNTER — Encounter (HOSPITAL_COMMUNITY): Payer: Self-pay | Admitting: *Deleted

## 2020-02-16 NOTE — Telephone Encounter (Signed)
Preadmission screen [phone] interpreter number

## 2020-02-18 ENCOUNTER — Ambulatory Visit: Payer: Self-pay | Admitting: *Deleted

## 2020-02-18 ENCOUNTER — Other Ambulatory Visit: Payer: Self-pay

## 2020-02-18 ENCOUNTER — Ambulatory Visit: Payer: Self-pay | Attending: Obstetrics and Gynecology

## 2020-02-18 ENCOUNTER — Encounter: Payer: Self-pay | Admitting: *Deleted

## 2020-02-18 VITALS — BP 126/70 | HR 79

## 2020-02-18 DIAGNOSIS — Z3A38 38 weeks gestation of pregnancy: Secondary | ICD-10-CM | POA: Insufficient documentation

## 2020-02-18 DIAGNOSIS — O365990 Maternal care for other known or suspected poor fetal growth, unspecified trimester, not applicable or unspecified: Secondary | ICD-10-CM

## 2020-02-18 DIAGNOSIS — O365930 Maternal care for other known or suspected poor fetal growth, third trimester, not applicable or unspecified: Secondary | ICD-10-CM

## 2020-02-23 ENCOUNTER — Other Ambulatory Visit: Payer: Self-pay | Admitting: Advanced Practice Midwife

## 2020-02-24 ENCOUNTER — Inpatient Hospital Stay (HOSPITAL_COMMUNITY): Payer: Medicaid Other | Admitting: Anesthesiology

## 2020-02-24 ENCOUNTER — Encounter (HOSPITAL_COMMUNITY): Payer: Self-pay | Admitting: Obstetrics and Gynecology

## 2020-02-24 ENCOUNTER — Other Ambulatory Visit: Payer: Self-pay

## 2020-02-24 ENCOUNTER — Ambulatory Visit (INDEPENDENT_AMBULATORY_CARE_PROVIDER_SITE_OTHER): Payer: Self-pay | Admitting: Obstetrics and Gynecology

## 2020-02-24 ENCOUNTER — Inpatient Hospital Stay (HOSPITAL_COMMUNITY)
Admission: AD | Admit: 2020-02-24 | Discharge: 2020-02-27 | DRG: 807 | Disposition: A | Discharge status: Home / Self Care | Payer: Medicaid Other | Attending: Family Medicine | Admitting: Family Medicine

## 2020-02-24 VITALS — BP 108/75 | HR 79 | Wt 185.0 lb

## 2020-02-24 DIAGNOSIS — O365930 Maternal care for other known or suspected poor fetal growth, third trimester, not applicable or unspecified: Secondary | ICD-10-CM | POA: Diagnosis present

## 2020-02-24 DIAGNOSIS — Z789 Other specified health status: Secondary | ICD-10-CM

## 2020-02-24 DIAGNOSIS — Z20822 Contact with and (suspected) exposure to covid-19: Secondary | ICD-10-CM | POA: Diagnosis present

## 2020-02-24 DIAGNOSIS — N939 Abnormal uterine and vaginal bleeding, unspecified: Secondary | ICD-10-CM | POA: Diagnosis present

## 2020-02-24 DIAGNOSIS — O368130 Decreased fetal movements, third trimester, not applicable or unspecified: Secondary | ICD-10-CM

## 2020-02-24 DIAGNOSIS — Z3A38 38 weeks gestation of pregnancy: Secondary | ICD-10-CM

## 2020-02-24 DIAGNOSIS — O365990 Maternal care for other known or suspected poor fetal growth, unspecified trimester, not applicable or unspecified: Secondary | ICD-10-CM | POA: Diagnosis present

## 2020-02-24 DIAGNOSIS — Z3483 Encounter for supervision of other normal pregnancy, third trimester: Secondary | ICD-10-CM

## 2020-02-24 DIAGNOSIS — O0993 Supervision of high risk pregnancy, unspecified, third trimester: Secondary | ICD-10-CM

## 2020-02-24 LAB — SARS CORONAVIRUS 2 BY RT PCR (HOSPITAL ORDER, PERFORMED IN ~~LOC~~ HOSPITAL LAB): SARS Coronavirus 2: NEGATIVE

## 2020-02-24 LAB — CBC
HCT: 34.8 % — ABNORMAL LOW (ref 36.0–46.0)
Hemoglobin: 10.7 g/dL — ABNORMAL LOW (ref 12.0–15.0)
MCH: 25.2 pg — ABNORMAL LOW (ref 26.0–34.0)
MCHC: 30.7 g/dL (ref 30.0–36.0)
MCV: 82.1 fL (ref 80.0–100.0)
Platelets: 184 10*3/uL (ref 150–400)
RBC: 4.24 MIL/uL (ref 3.87–5.11)
RDW: 13.6 % (ref 11.5–15.5)
WBC: 6.5 10*3/uL (ref 4.0–10.5)
nRBC: 0 % (ref 0.0–0.2)

## 2020-02-24 LAB — TYPE AND SCREEN
ABO/RH(D): O POS
Antibody Screen: NEGATIVE

## 2020-02-24 MED ORDER — LIDOCAINE HCL (PF) 1 % IJ SOLN: 30.0000 mL | INTRAMUSCULAR | Status: DC | PRN

## 2020-02-24 MED ORDER — SOD CITRATE-CITRIC ACID 500-334 MG/5ML PO SOLN: 30.0000 mL | ORAL | Status: DC | PRN

## 2020-02-24 MED ORDER — PHENYLEPHRINE 40 MCG/ML (10ML) SYRINGE FOR IV PUSH (FOR BLOOD PRESSURE SUPPORT): 80.0000 ug | PREFILLED_SYRINGE | INTRAVENOUS | Status: DC | PRN

## 2020-02-24 MED ORDER — FENTANYL CITRATE (PF) 100 MCG/2ML IJ SOLN: 50.0000 ug | INTRAMUSCULAR | Status: DC | PRN

## 2020-02-24 MED ORDER — FENTANYL-BUPIVACAINE-NACL 0.5-0.125-0.9 MG/250ML-% EP SOLN
12.0000 mL/h | EPIDURAL | Status: DC | PRN
  Filled 2020-02-24: qty 250

## 2020-02-24 MED ORDER — DIPHENHYDRAMINE HCL 50 MG/ML IJ SOLN: 12.5000 mg | INTRAMUSCULAR | Status: DC | PRN

## 2020-02-24 MED ORDER — EPHEDRINE 5 MG/ML INJ: 10.0000 mg | INTRAVENOUS | Status: DC | PRN

## 2020-02-24 MED ORDER — TERBUTALINE SULFATE 1 MG/ML IJ SOLN: 0.2500 mg | Freq: Once | INTRAMUSCULAR | Status: DC | PRN

## 2020-02-24 MED ORDER — LACTATED RINGERS IV SOLN: INTRAVENOUS | Status: DC

## 2020-02-24 MED ORDER — LACTATED RINGERS IV SOLN: 500.0000 mL | INTRAVENOUS | Status: DC | PRN

## 2020-02-24 MED ORDER — LIDOCAINE HCL (PF) 1 % IJ SOLN
INTRAMUSCULAR | Status: DC | PRN
  Administered 2020-02-24: 12 mL via EPIDURAL

## 2020-02-24 MED ORDER — ACETAMINOPHEN 325 MG PO TABS: 650.0000 mg | ORAL_TABLET | ORAL | Status: DC | PRN

## 2020-02-24 MED ORDER — MISOPROSTOL 25 MCG QUARTER TABLET: 25.0000 ug | ORAL_TABLET | ORAL | Status: DC | PRN

## 2020-02-24 MED ORDER — ONDANSETRON HCL 4 MG/2ML IJ SOLN: 4.0000 mg | Freq: Four times a day (QID) | INTRAMUSCULAR | Status: DC | PRN

## 2020-02-24 MED ORDER — LACTATED RINGERS IV SOLN: 500.0000 mL | Freq: Once | INTRAVENOUS | Status: DC

## 2020-02-24 MED ORDER — SODIUM CHLORIDE (PF) 0.9 % IJ SOLN
INTRAMUSCULAR | Status: DC | PRN
  Administered 2020-02-24: 12 mL/h via EPIDURAL

## 2020-02-24 MED ORDER — OXYTOCIN BOLUS FROM INFUSION
333.0000 mL | Freq: Once | INTRAVENOUS | Status: AC
  Administered 2020-02-25: 333 mL via INTRAVENOUS

## 2020-02-24 MED ORDER — OXYTOCIN-SODIUM CHLORIDE 30-0.9 UT/500ML-% IV SOLN
1.0000 m[IU]/min | INTRAVENOUS | Status: DC
  Administered 2020-02-24: 2 m[IU]/min via INTRAVENOUS

## 2020-02-24 MED ORDER — OXYTOCIN-SODIUM CHLORIDE 30-0.9 UT/500ML-% IV SOLN
2.5000 [IU]/h | INTRAVENOUS | Status: DC
  Administered 2020-02-25: 2.5 [IU]/h via INTRAVENOUS
  Filled 2020-02-24: qty 500

## 2020-02-24 NOTE — Progress Notes (Signed)
Prenatal Visit Note Date: 02/24/2020 Clinic: [facility]  Subjective:  Emily [name] is a 34 y.o. F5D3220 at [redacted]w[redacted]d being seen today for ongoing prenatal care.  She is currently monitored for the following issues for this high-risk pregnancy and has Supervision of high-risk pregnancy; Language barrier; and Poor fetal growth affecting management of mother in singleton pregnancy in third trimester on their problem list.  Patient reports vaginal bleeding (looks bright) since this morning and decreased FM since yesterday. .   Contractions: Not present. Vag. Bleeding: Scant.  Movement: (!) Decreased. Denies leaking of fluid.   The following portions of the patient's history were reviewed and updated as appropriate: allergies, current medications, past family history, past medical history, past social history, past surgical history and problem list. Problem list updated.  Objective:   Vitals:   02/24/20 1414  BP: 108/75  Pulse: 79  Weight: 185 lb (83.9 kg)    Fetal Status: Fetal Heart Rate (bpm): 149   Movement: (!) Decreased  Presentation: Vertex  General:  Alert, oriented and cooperative. Patient is in no acute distress.  Skin: Skin is warm and dry. No rash noted.   Cardiovascular: Normal heart rate noted  Respiratory: Normal respiratory effort, no problems with respiration noted  Abdomen: Soft, gravid, appropriate for gestational age. Pain/Pressure: Present     Pelvic:  Cervical exam performed Dilation: 4.5 Effacement (%): 50 Station: Ballotable  EGBUS: patient had two nickel sized dark, red blood clots, cervix visually closed, no active bleeding  Extremities: Normal range of motion.  Edema: None  Mental Status: Normal mood and affect. Normal behavior. Normal judgment and thought content.   Urinalysis:      Assessment and Plan:  Pregnancy: G3P2002 at [redacted]w[redacted]d  1. Language barrier Interpreter used  2. Vaginal bleeding Could be bloody show but  darker and less discharge like than normal. NST reactive in twenty minutes Given 38/6 and amount of VB, I told her I recommend delivery today, which she is amenable. L&D aware. GBS neg  3. Decreased fetal movements in third trimester, single or unspecified fetus See above  Term labor symptoms and general obstetric precautions including but not limited to vaginal bleeding, contractions, leaking of fluid and fetal movement were reviewed in detail with the patient. Please refer to After Visit Summary for other counseling recommendations.  No follow-ups on file.   Shandon Bing, MD

## 2020-02-24 NOTE — Progress Notes (Signed)
Emily [name] is a 34 y.o. J4G9201 at [redacted]w[redacted]d admitted for early labor and decreased fetal movement  Subjective: Wishes to proceed with AROM  Objective: BP 110/68   Pulse 68   Temp 98.4 F (36.9 C) (Oral)   Resp 17   Ht 5' 1.02" (1.55 m)   Wt 83.7 kg   LMP 05/28/2019 (Exact Date)   BMI 34.85 kg/m  No intake/output data recorded.  FHT:  FHR: 135 bpm, variability: moderate,  accelerations:  Present,  decelerations:  Absent UC:   regular, every 2-3 minutes  SVE:   Dilation: 5.5 Effacement (%): 80 Station: -2 Exam by:: Karl Ito, rnc   Pitocin @ 6 mu/min  Labs: Lab Results  Component Value Date   WBC 6.5 02/24/2020   HGB 10.7 (L) 02/24/2020   HCT 34.8 (L) 02/24/2020   MCV 82.1 02/24/2020   PLT 184 02/24/2020    Assessment / Plan: 34 y.o. E0F1219 [redacted]w[redacted]d admitted for early labor and decreased fetal movement.  #Labor: Pit started at 1801, now at 6. AROM with scant fluid at this time. #Pain: Epidural #FWB: Cat I strip #GBS negative  Margarette Asal [name] DO OB Fellow, Faculty Practice 02/24/2020, 11:13 PM

## 2020-02-24 NOTE — Anesthesia Procedure Notes (Signed)
Epidural Patient location during procedure: OB Start time: 02/24/2020 11:28 PM End time: 02/24/2020 11:38 PM  Staffing Anesthesiologist: Elmer Picker, MD Performed: anesthesiologist   Preanesthetic Checklist Completed: patient identified, IV checked, risks and benefits discussed, monitors and equipment checked, pre-op evaluation and timeout performed  Epidural Patient position: sitting Prep: DuraPrep and site prepped and draped Patient monitoring: continuous pulse ox, blood pressure, heart rate and cardiac monitor Approach: midline Location: L3-L4 Injection technique: LOR air  Needle:  Needle type: Tuohy  Needle gauge: 17 G Needle length: 9 cm Needle insertion depth: 6 cm Catheter type: closed end flexible Catheter size: 19 Gauge Catheter at skin depth: 12 cm Test dose: negative  Assessment Sensory level: T8 Events: blood not aspirated, injection not painful, no injection resistance, no paresthesia and negative IV test  Additional Notes Patient identified. Risks/Benefits/Options discussed with patient including but not limited to bleeding, infection, nerve damage, paralysis, failed block, incomplete pain control, headache, blood pressure changes, nausea, vomiting, reactions to medication both or allergic, itching and postpartum back pain. Confirmed with bedside nurse the patient's most recent platelet count. Confirmed with patient that they are not currently taking any anticoagulation, have any bleeding history or any family history of bleeding disorders. Patient expressed understanding and wished to proceed. All questions were answered. Sterile technique was used throughout the entire procedure. Please see nursing notes for vital signs. Test dose was given through epidural catheter and negative prior to continuing to dose epidural or start infusion. Warning signs of high block given to the patient including shortness of breath, tingling/numbness in hands, complete motor block,  or any concerning symptoms with instructions to call for help. Patient was given instructions on fall risk and not to get out of bed. All questions and concerns addressed with instructions to call with any issues or inadequate analgesia.  Reason for block:procedure for pain

## 2020-02-24 NOTE — Progress Notes (Addendum)
Labor Progress Note Emily [name] is a 34 y.o. G3P2002 at [redacted]w[redacted]d presented for augmentation of labor secondary to decreased fetal movement.   S: Doing well overall, resting in bed. Pain 5/10.  O:  BP 110/68   Pulse 68   Temp 98.4 F (36.9 C) (Oral)   Resp 17   Ht 5' 1.02" (1.55 m)   Wt 83.7 kg   LMP 05/28/2019 (Exact Date)   BMI 34.85 kg/m  EFM: 135/mod variability/+ accels/no decels Toco: q2-3 min ctx  CVE: Dilation: 4.5 Effacement (%): 80 Cervical Position: Posterior Station: -2 Presentation: Vertex Exam by:: Karl Ito, rnc    A&P: 34 y.o. [id number] [redacted]w[redacted]d presenting with decreased fetal movement. #Labor: Pit at 6. Will recheck in 2 hours and consider AROM if indicated at that time. #Pain: Per patient request #FWB: Cat I strip #GBS negative   Roma Kayser, MD 9:53 PM

## 2020-02-24 NOTE — H&P (Addendum)
OBSTETRIC ADMISSION HISTORY AND PHYSICAL  Emily [name] is a 34 y.o. female G3P2002 at [redacted]w[redacted]d by L/10 who presents for augmentation of labor secondary to decreased fetal movement. Patient noticed some vaginal bleeding this morning after using the restroom. Described as spotting, and not enough volume to soak a pad or toilet paper. Reports that the vaginal bleeding has diminished. She reports headaches that began over the past two days and rates her pain as a 3/10 intensity. She reports no LOF, no blurry vision, no peripheral edema, and no RUQ pain. Not experiencing contractions. She plans on breast and bottle feeding. She requests BTL (if c-section) vs IUD at HD if vaginal birth for birth control. She received her prenatal care at  [facility]    Dating: By LMP --->  Estimated Date of Delivery: 03/03/20  Sono:   @[redacted]w[redacted]d , CWD, normal anatomy, cephalic presentation  @33w0 , 1840 g, 12 % EFW  Prenatal History/Complications: Poor fetal growth, resolved  Past Medical History: Past Medical History:  Diagnosis Date  . Medical history non-contributory     Past Surgical History: Past Surgical History:  Procedure Laterality Date  . NO PAST SURGERIES      Obstetrical History: OB History     Gravida  3   Para  2   Term  2   Preterm      AB  0   Living  2      SAB      TAB      Ectopic  0   Multiple  0   Live Births  2           Social History: Social History   Socioeconomic History  . Marital status: Married    Spouse name: Not on file  . Number of children: Not on file  . Years of education: Not on file  . Highest education level: Not on file  Occupational History  . Not on file  Tobacco Use  . Smoking status: Never Smoker  . Smokeless tobacco: Never Used  Vaping Use  . Vaping Use: Never used  Substance and Sexual Activity  . Alcohol use: Not Currently  . Drug use: No  . Sexual activity: Yes    Comment: intercourse age 39,less than 5  sexual partners  Other Topics Concern  . Not on file  Social History Narrative  . Not on file   Social Determinants of Health   Financial Resource Strain:   . Difficulty of Paying Living Expenses:   Food Insecurity:   . Worried About in the Last Year:   . 30,less in the Last Year:   Transportation Needs:   . Programme researcher, broadcasting/film/video (Medical):   Barista Lack of Transportation (Non-Medical):   Physical Activity:   . Days of Exercise per Week:   . Minutes of Exercise per Session:   Stress:   . Feeling of Stress :   Social Connections:   . Frequency of Communication with Friends and Family:   . Frequency of Social Gatherings with Friends and Family:   . Attends Religious Services:   . Active Member of Clubs or Organizations:   . Attends Freight forwarder Meetings:   Marland Kitchen Marital Status:     Family History: Family History  Problem Relation Age of Onset  . Asthma Neg Hx   . Arthritis Neg Hx   . Alcohol abuse Neg Hx   . Birth defects Neg Hx   .  COPD Neg Hx   . Cancer Neg Hx   . Depression Neg Hx   . Diabetes Neg Hx   . Drug abuse Neg Hx   . Early death Neg Hx   . Hearing loss Neg Hx   . Heart disease Neg Hx   . Hypertension Neg Hx   . Hyperlipidemia Neg Hx   . Kidney disease Neg Hx   . Learning disabilities Neg Hx   . Mental illness Neg Hx   . Mental retardation Neg Hx   . Miscarriages / Stillbirths Neg Hx   . Stroke Neg Hx   . Vision loss Neg Hx   . Varicose Veins Neg Hx     Allergies: No Known Allergies  Medications Prior to Admission  Medication Sig Dispense Refill Last Dose  . Prenatal Vit-Fe Fumarate-FA (MULTIVITAMIN-PRENATAL) 27-0.8 MG TABS tablet Take 1 tablet by mouth daily at 12 noon.        Review of Systems   All systems reviewed and negative except as stated in HPI  Blood pressure 120/64, pulse 83, temperature 97.9 F (36.6 C), temperature source Oral, resp. rate 18, height 5' 1.02" (1.55 m), weight 83.7 kg, last  menstrual period 05/28/2019. General appearance:  Alert, NAD. Well-appearing female lying in bed. Lungs: clear to auscultation bilaterally Heart: regular rate and rhythm Abdomen: soft, non-tender; bowel sounds normal Pelvic: 3/30/-1 Extremities: Homans sign is negative, no sign of DVT Presentation: cephalic Fetal monitoringBaseline: 135 bpm, Variability: Good {> 6 bpm), Accelerations: Reactive and Decelerations: Absent Uterine activity None    Prenatal labs: ABO, Rh: --/--/O POS (08/12 1641) Antibody: NEG (08/12 1641) Rubella: 7.43 (02/01 1450) RPR: Non Reactive (05/25 0856)  HBsAg: Negative (02/01 1450)  HIV: Non Reactive (05/25 0854)  GBS: Negative/-- (07/28 1624)  1 hr Glucola normal Genetic screening  declined Anatomy US concern for IUGR ultimately resolved, normal anatomy  Prenatal Transfer Tool  Maternal Diabetes: No Genetic Screening: Declined Maternal Ultrasounds/Referrals: Normal Fetal Ultrasounds or other Referrals:  None Maternal Substance Abuse:  No Significant Maternal Medications:  None Significant Maternal Lab Results: Group B Strep negative  Results for orders placed or performed during the hospital encounter of 02/24/20 (from the past 24 hour(s))  CBC   Collection Time: 02/24/20  4:33 PM  Result Value Ref Range   WBC 6.5 4.0 - 10.5 K/uL   RBC 4.24 3.87 - 5.11 MIL/uL   Hemoglobin 10.7 (L) 12.0 - 15.0 g/dL   HCT 70.1 (L) 36 - 46 %   MCV 82.1 80.0 - 100.0 fL   MCH 25.2 (L) 26.0 - 34.0 pg   MCHC 30.7 30.0 - 36.0 g/dL   RDW 77.9 39.0 - 30.0 %   Platelets 184 150 - 400 K/uL   nRBC 0.0 0.0 - 0.2 %  Type and screen   Collection Time: 02/24/20  4:41 PM  Result Value Ref Range   ABO/RH(D) O POS    Antibody Screen NEG    Sample Expiration      02/27/2020,2359 Performed at [facility], [address] [place], [place] [address]     Patient Active Problem List   Diagnosis Date Noted  . Vaginal bleeding 02/24/2020  . Decreased fetal movements in  third trimester 02/24/2020  . IUGR (intrauterine growth restriction) affecting care of mother 02/24/2020  . Poor fetal growth affecting management of mother in singleton pregnancy in third trimester 11/09/2019  . Language barrier 08/16/2019  . Supervision of high-risk pregnancy 08/29/2013    Assessment/Plan:  Kanisha Duba Arville [name] is a 34 y.o. G3P2002 at [redacted]w[redacted]d here for decreased fetal movement.  #Labor: Presenting 3/30/-3. Will initiate pitocin at this time. Will continue to reassess and augment labor PRN. #Pain: Epidural PRN. #FWB: Category I strip. #ID: GBS negative. #MOF: Breast and Bottle. #MOC: Planning for IUD at HD. Would like BTL if cesarean section. #Circ: Declines.  Eliezer Lofts, Medical Student  02/24/2020, 5:41 PM   Attestation of Supervision of Student:  I confirm that I have verified the information documented in the resident's note and that I have also personally reperformed the history, physical exam and all medical decision making activities.  I have verified that all services and findings are accurately documented in this student's note; and I agree with management and plan as outlined in the documentation. I have also made any necessary editorial changes.  Sheila Oats, MD [facility], [facility] 02/24/2020 6:34 PM

## 2020-02-24 NOTE — Anesthesia Preprocedure Evaluation (Signed)
Anesthesia Evaluation  Patient identified by MRN, date of birth, ID band Patient awake    Reviewed: Allergy & Precautions, NPO status , Patient's Chart, lab work & pertinent test results  Airway Mallampati: II  TM Distance: >3 FB Neck ROM: Full    Dental no notable dental hx.    Pulmonary neg pulmonary ROS,    Pulmonary exam normal breath sounds clear to auscultation       Cardiovascular negative cardio ROS Normal cardiovascular exam Rhythm:Regular Rate:Normal     Neuro/Psych negative neurological ROS  negative psych ROS   GI/Hepatic negative GI ROS, Neg liver ROS,   Endo/Other  negative endocrine ROS  Renal/GU negative Renal ROS  negative genitourinary   Musculoskeletal negative musculoskeletal ROS (+)   Abdominal   Peds  Hematology negative hematology ROS (+)   Anesthesia Other Findings IOL for DFM  Reproductive/Obstetrics (+) Pregnancy                             Anesthesia Physical Anesthesia Plan  ASA: II  Anesthesia Plan: Epidural   Post-op Pain Management:    Induction:   PONV Risk Score and Plan: Treatment may vary due to age or medical condition  Airway Management Planned: Natural Airway  Additional Equipment:   Intra-op Plan:   Post-operative Plan:   Informed Consent: I have reviewed the patients History and Physical, chart, labs and discussed the procedure including the risks, benefits and alternatives for the proposed anesthesia with the patient or authorized representative who has indicated his/her understanding and acceptance.       Plan Discussed with: Anesthesiologist  Anesthesia Plan Comments: (Patient identified. Risks, benefits, options discussed with patient including but not limited to bleeding, infection, nerve damage, paralysis, failed block, incomplete pain control, headache, blood pressure changes, nausea, vomiting, reactions to medication,  itching, and post partum back pain. Confirmed with bedside nurse the patient's most recent platelet count. Confirmed with the patient that they are not taking any anticoagulation, have any bleeding history or any family history of bleeding disorders. Patient expressed understanding and wishes to proceed. All questions were answered. )        Anesthesia Quick Evaluation

## 2020-02-25 ENCOUNTER — Encounter (HOSPITAL_COMMUNITY): Payer: Self-pay | Admitting: Obstetrics and Gynecology

## 2020-02-25 DIAGNOSIS — O368130 Decreased fetal movements, third trimester, not applicable or unspecified: Secondary | ICD-10-CM

## 2020-02-25 DIAGNOSIS — Z3A38 38 weeks gestation of pregnancy: Secondary | ICD-10-CM

## 2020-02-25 LAB — RPR: RPR Ser Ql: NONREACTIVE

## 2020-02-25 MED ORDER — ONDANSETRON HCL 4 MG/2ML IJ SOLN: 4.0000 mg | INTRAMUSCULAR | Status: DC | PRN

## 2020-02-25 MED ORDER — TETANUS-DIPHTH-ACELL PERTUSSIS 5-2.5-18.5 LF-MCG/0.5 IM SUSP: 0.5000 mL | Freq: Once | INTRAMUSCULAR | Status: DC

## 2020-02-25 MED ORDER — COCONUT OIL OIL: 1.0000 "application " | TOPICAL_OIL | Status: DC | PRN

## 2020-02-25 MED ORDER — SIMETHICONE 80 MG PO CHEW: 80.0000 mg | CHEWABLE_TABLET | ORAL | Status: DC | PRN

## 2020-02-25 MED ORDER — ONDANSETRON HCL 4 MG PO TABS: 4.0000 mg | ORAL_TABLET | ORAL | Status: DC | PRN

## 2020-02-25 MED ORDER — BENZOCAINE-MENTHOL 20-0.5 % EX AERO
1.0000 "application " | INHALATION_SPRAY | CUTANEOUS | Status: DC | PRN
  Administered 2020-02-25: 1 via TOPICAL
  Filled 2020-02-25: qty 56

## 2020-02-25 MED ORDER — DIPHENHYDRAMINE HCL 25 MG PO CAPS
50.0000 mg | ORAL_CAPSULE | Freq: Once | ORAL | Status: AC
  Administered 2020-02-25: 50 mg via ORAL
  Filled 2020-02-25: qty 2

## 2020-02-25 MED ORDER — DIPHENHYDRAMINE HCL 25 MG PO CAPS: 25.0000 mg | ORAL_CAPSULE | Freq: Four times a day (QID) | ORAL | Status: DC | PRN

## 2020-02-25 MED ORDER — ZOLPIDEM TARTRATE 5 MG PO TABS: 5.0000 mg | ORAL_TABLET | Freq: Every evening | ORAL | Status: DC | PRN

## 2020-02-25 MED ORDER — IBUPROFEN 600 MG PO TABS
600.0000 mg | ORAL_TABLET | Freq: Four times a day (QID) | ORAL | Status: DC
  Administered 2020-02-25 – 2020-02-27 (×7): 600 mg via ORAL
  Filled 2020-02-25 (×6): qty 1

## 2020-02-25 MED ORDER — PRENATAL MULTIVITAMIN CH
1.0000 | ORAL_TABLET | Freq: Every day | ORAL | Status: DC
  Administered 2020-02-26: 1 via ORAL

## 2020-02-25 MED ORDER — SENNOSIDES-DOCUSATE SODIUM 8.6-50 MG PO TABS
2.0000 | ORAL_TABLET | ORAL | Status: DC
  Administered 2020-02-25 – 2020-02-26 (×2): 2 via ORAL
  Filled 2020-02-25 (×2): qty 2

## 2020-02-25 MED ORDER — DIBUCAINE (PERIANAL) 1 % EX OINT: 1.0000 "application " | TOPICAL_OINTMENT | CUTANEOUS | Status: DC | PRN

## 2020-02-25 MED ORDER — WITCH HAZEL-GLYCERIN EX PADS: 1.0000 "application " | MEDICATED_PAD | CUTANEOUS | Status: DC | PRN

## 2020-02-25 MED ORDER — ACETAMINOPHEN 325 MG PO TABS
650.0000 mg | ORAL_TABLET | ORAL | Status: DC | PRN
  Administered 2020-02-25: 650 mg via ORAL
  Filled 2020-02-25 (×2): qty 2

## 2020-02-25 NOTE — Progress Notes (Signed)
Labor Progress Note Emily [name] Emily [name] is a 34 y.o. G3P2002 at [redacted]w[redacted]d admitted for early labor and decreased fetal movement.  S: Resting comfortably, pain well-controlled with epidural.  O:  BP (!) 102/50   Pulse 76   Temp 98.2 F (36.8 C) (Oral)   Resp 17   Ht 5' 1.02" (1.55 m)   Wt 83.7 kg   LMP 05/28/2019 (Exact Date)   BMI 34.85 kg/m  EFM: 125/mod variability/+ accels/no decels  CVE: Dilation: 6 Effacement (%): 90 Cervical Position: Posterior Station: -2 Presentation: Vertex Exam by:: Dr. Gus Height   A&P: 34 y.o. P2T6244 [redacted]w[redacted]d admitted for early labor and decreased fetal movement. #Labor: Making some cervical change, now 6cm dilated. Pitocin at 8. Will consider placing IUPC if not dilated further at next check. #Pain: Epidural #FWB: Cat I #GBS negative   Roma Kayser, MD 4:33 AM

## 2020-02-25 NOTE — Anesthesia Postprocedure Evaluation (Signed)
Anesthesia Post Note  Patient: Emily [name]  Procedure(s) Performed: AN AD HOC LABOR EPIDURAL     Patient location during evaluation: Mother Baby Anesthesia Type: Epidural Level of consciousness: awake and alert Pain management: pain level controlled Vital Signs Assessment: post-procedure vital signs reviewed and stable Respiratory status: spontaneous breathing, nonlabored ventilation and respiratory function stable Cardiovascular status: stable Postop Assessment: no headache, no backache and epidural receding Anesthetic complications: no   No complications documented.  Last Vitals:  Vitals:   02/25/20 1021 02/25/20 1100  BP: 111/88 108/66  Pulse: 75 70  Resp:  18  Temp:  36.9 C    Last Pain:  Vitals:   02/25/20 1200  TempSrc:   PainSc: 0-No pain   Pain Goal:                   Janette Harvie

## 2020-02-25 NOTE — Lactation Note (Signed)
This note was copied from a baby's chart. Lactation Consultation Note  Patient Name: Boy Kolbi Tofte ZPHXTAV [id number] Date: 02/25/2020    [facility] Note:  Per RN, mother declines a lactation consult.   Maternal Data    Feeding Feeding Type: Bottle Fed - Formula Nipple Type: Slow - flow  LATCH Score                   Interventions    Lactation Tools Discussed/Used     Consult Status      Sid Greener R Synthia Fairbank 02/25/2020, 4:15 PM

## 2020-02-25 NOTE — Progress Notes (Signed)
Emily [name] Emily [name] is a 34 y.o. [phone] at [redacted]w[redacted]d admitted for early labor and decreased fetal movement  Subjective: Comfortable with epidural  Objective: BP (!) 107/54    Pulse 72    Temp (!) 97.5 F (36.4 C) (Oral)    Resp 17    Ht 5' 1.02" (1.55 m)    Wt 83.7 kg    LMP 05/28/2019 (Exact Date)    BMI 34.85 kg/m  Total I/O In: 1229 [I.V.:1229] Out: -   FHT:  FHR: 130 bpm, variability: moderate,  accelerations:  Present,  decelerations:  Absent UC:   regular, every 2-3 minutes  SVE:   7/80/0  Pitocin @ 12 mu/min  Labs: Lab Results  Component Value Date   WBC 6.5 02/24/2020   HGB 10.7 (L) 02/24/2020   HCT 34.8 (L) 02/24/2020   MCV 82.1 02/24/2020   PLT 184 02/24/2020    Assessment / Plan: 34 y.o.G3P2002 [redacted]w[redacted]d admitted for early labor and decreased fetal movement.  #Labor:Pit started at 1801, now at 6. AROM was performed at 2300 last night, but forebag felt on this exam, so AROM performed with copious amount of clear fluid. #Pain:Epidural #FWB:Cat I strip #GBSnegative  Batsheva Stevick L Leemon Ayala DO OB Fellow, Faculty Practice 02/25/2020, 6:53 AM

## 2020-02-25 NOTE — Discharge Summary (Signed)
Postpartum Discharge Summary    Patient Name: Emily [name] DOB: 1986/07/06 MRN: [id number]  Date of admission: 02/24/2020 Delivery date:02/25/2020  Delivering provider: Starr Lake  Date of discharge: 02/26/2020  Admitting diagnosis: IUGR (intrauterine growth restriction) affecting care of mother [O36.5990] Intrauterine pregnancy: [redacted]w[redacted]d    Secondary diagnosis:  Active Problems:   Language barrier   Vaginal bleeding   Decreased fetal movements in third trimester  Additional problems: none     Discharge diagnosis: Term Pregnancy Delivered                                              Post partum procedures:NA Augmentation: AROM and Pitocin Complications: None  Hospital course: Induction of Labor With Vaginal Delivery   34y.o. yo G3P2002 at 368w0das admitted to the hospital 02/24/2020 for induction of labor.  Indication for induction: decreased fetal movement.  Patient had an uncomplicated labor course as follows: Membrane Rupture Time/Date: 11:10 PM ,02/24/2020   Delivery Method:Vaginal, Spontaneous  Episiotomy: None  Lacerations:  2nd degree  Details of delivery can be found in separate delivery note.  Patient had a routine postpartum course. Patient is discharged home 02/26/20.  Newborn Data: Birth date:02/25/2020  Birth time:8:58 AM  Gender:Female  Living status:Living  Apgars:9 ,  Weight:2960 g   Magnesium Sulfate received: No BMZ received: No Rhophylac:No MMR:No T-DaP:declined Flu: declined Transfusion:No  Physical exam  Vitals:   02/25/20 1540 02/25/20 1958 02/25/20 2357 02/26/20 0514  BP: 117/67 117/66 110/72 108/69  Pulse: 75 75 76 60  Resp: 19 18 18 18   Temp: 98.4 F (36.9 C)     TempSrc: Oral     SpO2:  100% 100% 100%  Weight:      Height:       General: alert, cooperative and no distress Lochia: appropriate Uterine Fundus: firm Incision: N/A DVT Evaluation: No evidence of DVT seen on physical exam. No cords or  calf tenderness. No significant calf/ankle edema. Labs: Lab Results  Component Value Date   WBC 6.5 02/24/2020   HGB 10.7 (L) 02/24/2020   HCT 34.8 (L) 02/24/2020   MCV 82.1 02/24/2020   PLT 184 02/24/2020   CMP Latest Ref Rng & Units 04/15/2017  Glucose 65 - 99 mg/dL 87   Edinburgh Score: Edinburgh Postnatal Depression Scale Screening Tool 02/25/2020  I have been able to laugh and see the funny side of things. 0  I have looked forward with enjoyment to things. 0  I have blamed myself unnecessarily when things went wrong. 0  I have been anxious or worried for no good reason. 0  I have felt scared or panicky for no good reason. 0  Things have been getting on top of me. 0  I have been so unhappy that I have had difficulty sleeping. 3  I have felt sad or miserable. 0  I have been so unhappy that I have been crying. 0  The thought of harming myself has occurred to me. 0  Edinburgh Postnatal Depression Scale Total 3     After visit meds:  Allergies as of 02/26/2020   No Known Allergies     Medication List    TAKE these medications   ibuprofen 600 MG tablet Commonly known as: ADVIL Take 1 tablet (600 mg total) by mouth every 6 (six) hours as needed.  multivitamin-prenatal 27-0.8 MG Tabs tablet Take 1 tablet by mouth daily at 12 noon.        Discharge home in stable condition Infant Feeding: Bottle Infant Disposition:home with mother Discharge instruction: per After Visit Summary and Postpartum booklet. Activity: Advance as tolerated. Pelvic rest for 6 weeks.  Diet: routine diet Future Appointments: Future Appointments  Date Time Provider [place]  03/30/2020  3:00 PM Aletha Halim, MD [facility]   Follow up Visit:  [place] [facility] [place] [facility]. Schedule an appointment as soon as possible for a visit in 4 week(s).   Specialty: Obstetrics and Gynecology Why: Make appointment to be seen in 4 weeks for  postpartum appointment  Contact information: [place] [phone]               Please schedule this patient for a Virtual postpartum visit in 4 weeks with the following provider: Any provider. Additional Postpartum F/U:None, please refer to [facility] for IUD placement  High risk pregnancy complicated by: IUGR (now resolved) Delivery mode:  Vaginal, Spontaneous  Anticipated Birth Control:  will get IUD at [facility]   02/26/2020 Lajean Manes, CNM

## 2020-02-26 ENCOUNTER — Other Ambulatory Visit (HOSPITAL_COMMUNITY): Payer: Self-pay

## 2020-02-26 MED ORDER — IBUPROFEN 600 MG PO TABS: 600.0000 mg | ORAL_TABLET | Freq: Four times a day (QID) | ORAL | 0 refills | Status: DC | PRN

## 2020-02-26 NOTE — Progress Notes (Signed)
Baby's bili is high. Mom stated, with interpreter,  that she wants to stay a patient if the baby stays.

## 2020-02-27 LAB — GLUCOSE, CAPILLARY: Glucose-Capillary: 85 mg/dL (ref 70–99)

## 2020-02-27 NOTE — Discharge Summary (Signed)
Postpartum Discharge Summary    Patient Name: Emily [name] DOB: Apr 06, 1986 MRN: [id number]  Date of admission: 02/24/2020 Delivery date:02/25/2020  Delivering provider: Starr Lake  Date of discharge: 02/27/2020  Admitting diagnosis: IUGR (intrauterine growth restriction) affecting care of mother [O36.5990] Intrauterine pregnancy: [redacted]w[redacted]d    Secondary diagnosis:  Active Problems:   Language barrier   Vaginal bleeding   Decreased fetal movements in third trimester  Additional problems: none     Discharge diagnosis: Term Pregnancy Delivered                                              Post partum procedures:NA Augmentation: AROM and Pitocin Complications: None  Hospital course: Induction of Labor With Vaginal Delivery   34y.o. yo G3P2002 at 331w0das admitted to the hospital 02/24/2020 for induction of labor.  Indication for induction: decreased fetal movement.  Patient had an uncomplicated labor course as follows: Membrane Rupture Time/Date: 11:10 PM ,02/24/2020   Delivery Method:Vaginal, Spontaneous  Episiotomy: None  Lacerations:  2nd degree  Details of delivery can be found in separate delivery note.  Patient had a routine postpartum course. Patient is discharged home 02/27/20.  Newborn Data: Birth date:02/25/2020  Birth time:8:58 AM  Gender:Female  Living status:Living  Apgars:9 ,  5 minute apgar Not found in delivery note Weight:2960 g   Magnesium Sulfate received: No BMZ received: No Rhophylac:No MMR:No T-DaP:declined Flu: declined Transfusion:No  Physical exam  Vitals:   02/26/20 0514 02/26/20 1228 02/26/20 2143 02/27/20 0523  BP: 108/69 114/73 115/69 112/72  Pulse: 60 78 64 68  Resp: 18 17 16 16   Temp:  97.8 F (36.6 C) 98.4 F (36.9 C)   TempSrc:   Oral Oral  SpO2: 100% 99% 99% 98%  Weight:      Height:       General: alert, cooperative and no distress Lochia: appropriate Uterine Fundus: firm Incision: N/A DVT  Evaluation: No evidence of DVT seen on physical exam. No cords or calf tenderness. No significant calf/ankle edema. Labs: Lab Results  Component Value Date   WBC 6.5 02/24/2020   HGB 10.7 (L) 02/24/2020   HCT 34.8 (L) 02/24/2020   MCV 82.1 02/24/2020   PLT 184 02/24/2020   CMP Latest Ref Rng & Units 04/15/2017  Glucose 65 - 99 mg/dL 87   Edinburgh Score: Edinburgh Postnatal Depression Scale Screening Tool 02/25/2020  I have been able to laugh and see the funny side of things. 0  I have looked forward with enjoyment to things. 0  I have blamed myself unnecessarily when things went wrong. 0  I have been anxious or worried for no good reason. 0  I have felt scared or panicky for no good reason. 0  Things have been getting on top of me. 0  I have been so unhappy that I have had difficulty sleeping. 3  I have felt sad or miserable. 0  I have been so unhappy that I have been crying. 0  The thought of harming myself has occurred to me. 0  Edinburgh Postnatal Depression Scale Total 3     After visit meds:  Allergies as of 02/27/2020   No Known Allergies     Medication List    STOP taking these medications   acetaminophen 325 MG tablet Commonly known as: TYLENOL  TAKE these medications   ibuprofen 600 MG tablet Commonly known as: ADVIL Take 1 tablet (600 mg total) by mouth every 6 (six) hours as needed.   multivitamin-prenatal 27-0.8 MG Tabs tablet Take 1 tablet by mouth daily at 12 noon.        Discharge home in stable condition Infant Feeding: Bottle Infant Disposition:home with mother Discharge instruction: per After Visit Summary and Postpartum booklet. Activity: Advance as tolerated. Pelvic rest for 6 weeks.  Diet: routine diet Future Appointments: Future Appointments  Date Time Provider [place]  03/30/2020  3:00 PM Aletha Halim, MD [facility]   Follow up Visit:  [place] [facility] [place] [facility]. Schedule an appointment as soon as possible for a visit in 4 week(s).   Specialty: Obstetrics and Gynecology Why: Make appointment to be seen in 4 weeks for postpartum appointment  Contact information: [place] [phone]               Please schedule this patient for a Virtual postpartum visit in 4 weeks with the following provider: Any provider. Additional Postpartum F/U:None, please refer to [facility] for IUD placement  High risk pregnancy complicated by: IUGR (now resolved) Delivery mode:  Vaginal, Spontaneous  Anticipated Birth Control:  will get IUD at [facility]   In to see patient on last day of hospital stay as patient did not go home yesterday (8/14). Patient ambulating, tolerating food, denies pain.  VSS, no elevated BPs.    02/27/2020 Starr Lake, CNM

## 2020-02-28 ENCOUNTER — Inpatient Hospital Stay (HOSPITAL_COMMUNITY): Payer: Self-pay

## 2020-02-28 ENCOUNTER — Inpatient Hospital Stay (HOSPITAL_COMMUNITY): Admission: AD | Admit: 2020-02-28 | Payer: Self-pay | Source: Home / Self Care | Admitting: Obstetrics & Gynecology

## 2020-03-01 ENCOUNTER — Encounter (HOSPITAL_COMMUNITY): Payer: Self-pay

## 2020-03-30 ENCOUNTER — Ambulatory Visit (INDEPENDENT_AMBULATORY_CARE_PROVIDER_SITE_OTHER): Payer: Self-pay | Admitting: Obstetrics and Gynecology

## 2020-03-30 ENCOUNTER — Encounter: Payer: Self-pay | Admitting: Obstetrics and Gynecology

## 2020-03-30 ENCOUNTER — Other Ambulatory Visit: Payer: Self-pay

## 2020-03-30 NOTE — Progress Notes (Signed)
    Post Partum Visit Note  Emily [name] is a 34 y.o. G68P2002 female who presents for a postpartum visit. She is 4 weeks postpartum following a normal spontaneous vaginal delivery/2nd degree on 8/13 @ 39wks.  I have fully reviewed the prenatal and intrapartum course. The delivery was at 39.0 gestational weeks.  Anesthesia: epidural. Postpartum course has been uncomplicated. Baby is doing well. Baby is feeding by pumping and nursing. Bleeding no bleeding. Bowel function is normal. Bladder function is normal. Patient is not sexually active. Contraception method is undecided between IUD and Nexplanon. Postpartum depression screening: negative.  patient not having any bleeding, spotting.   Review of Systems Pertinent items are noted in HPI.    Objective:  Blood pressure 113/75, pulse 65, unknown if currently breastfeeding.  NAD  Assessment:   Patient doing well  Plan:   Routine care.   Interpreter used  [place] Bing, MD [facility] [employer], [facility]

## 2022-04-24 IMAGING — US US MFM FETAL BPP W/O NON-STRESS
1 series · 14 of 28 positions shown · non-contrast
Comparison: none

[Series 1: us mfm fetal bpp w/o non-stress · 32 acquisitions, 14 frames shown]
[im 2/32]
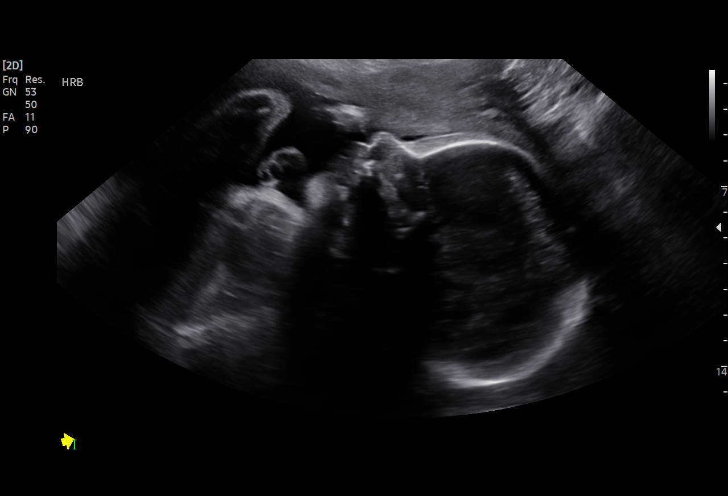
[im 4/32]
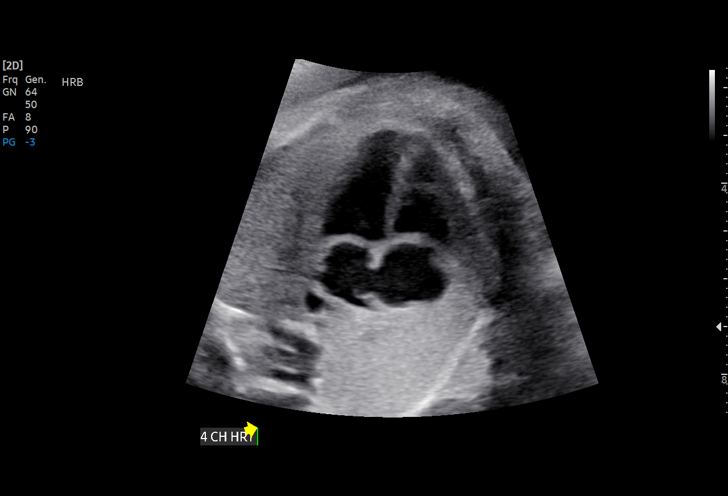
[im 6/32]
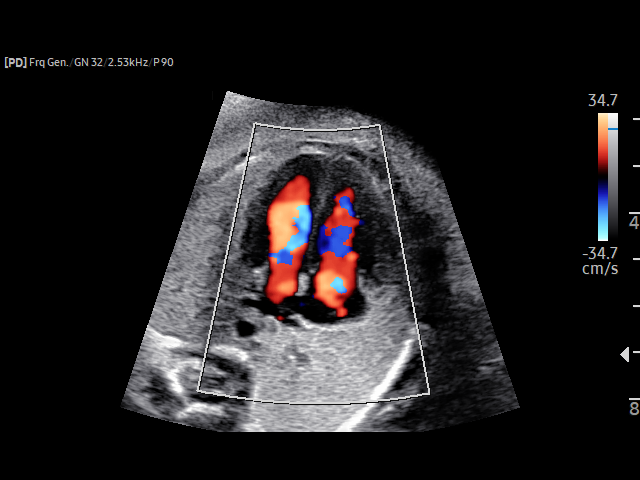
[im 9/32]
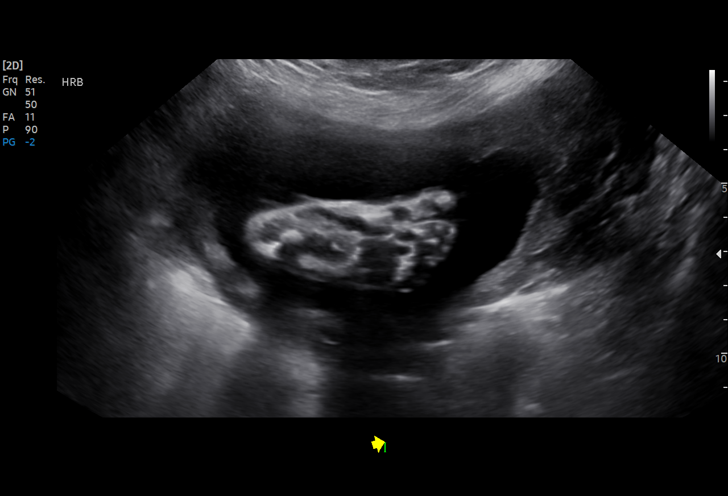
[im 11/32]
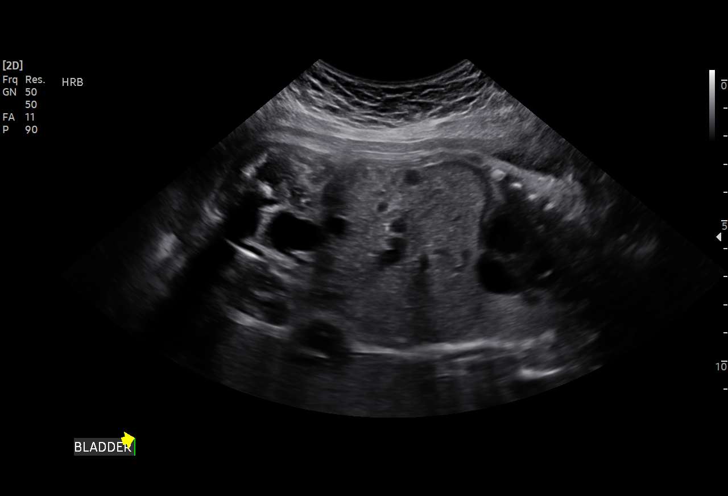
[im 13/32]
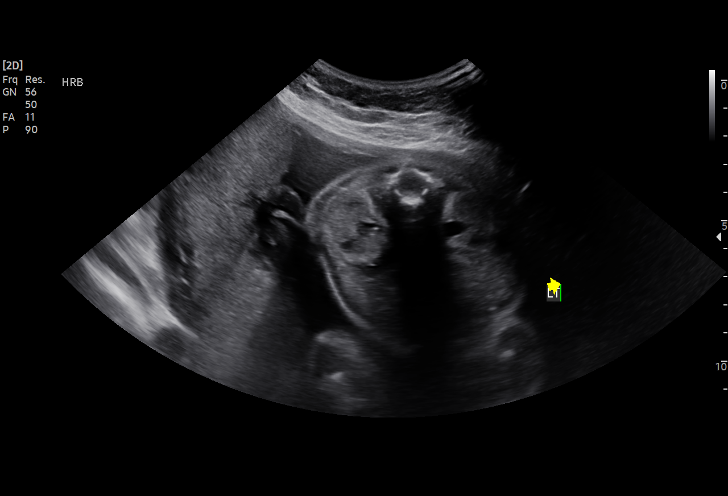
[im 15/32]
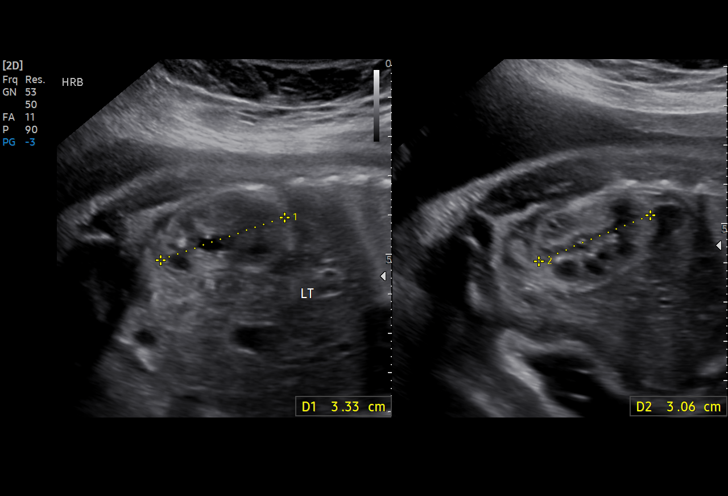
[im 18/32]
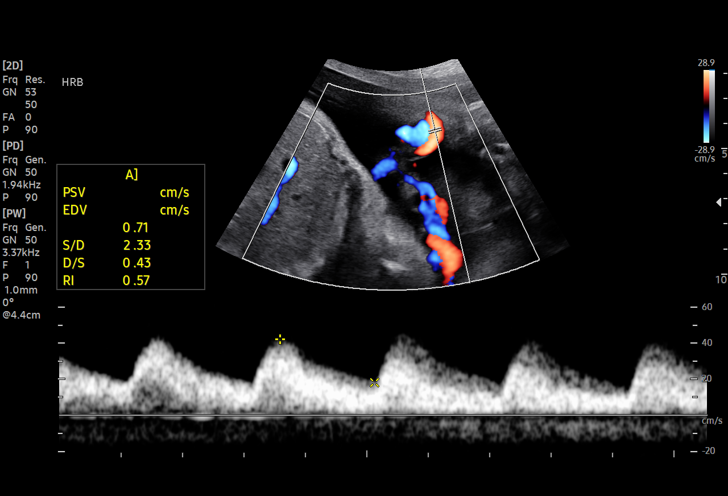
[im 20/32]
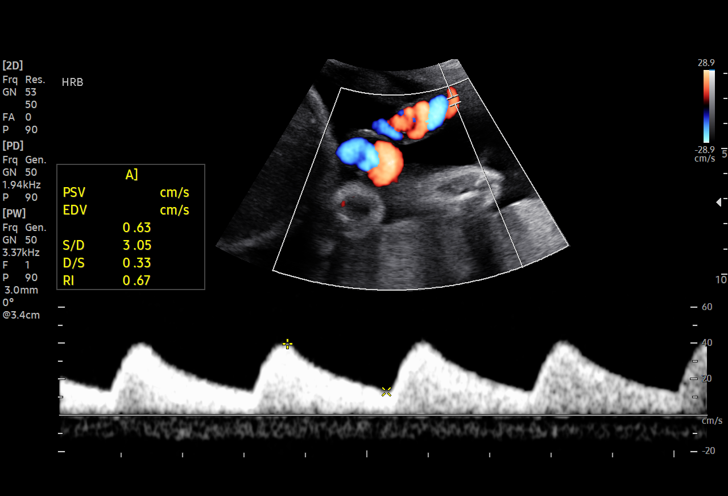
[im 22/32]
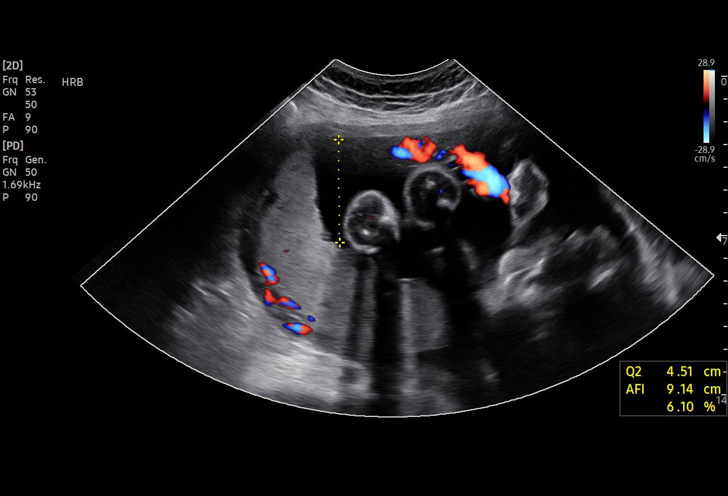
[im 25/32]
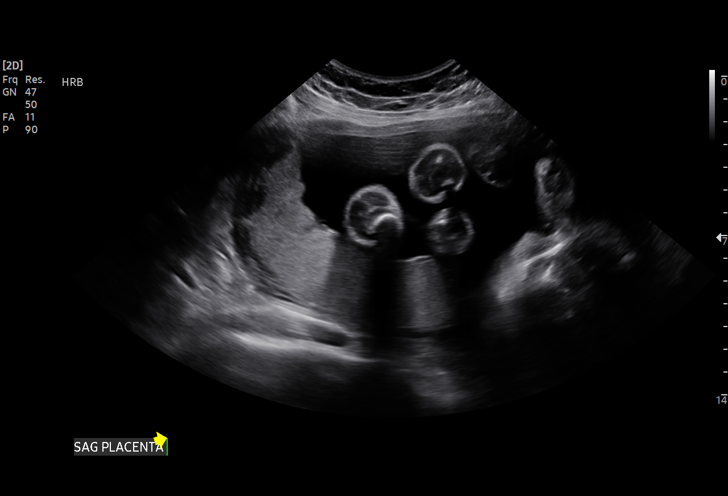
[im 27/32]
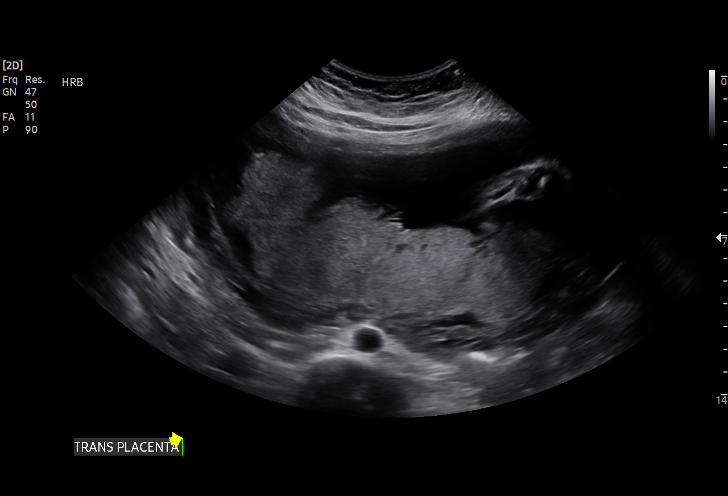
[im 29/32]
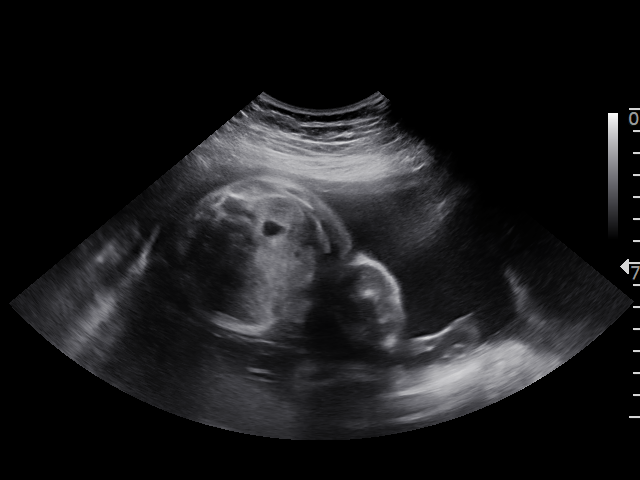
[im 32/32]
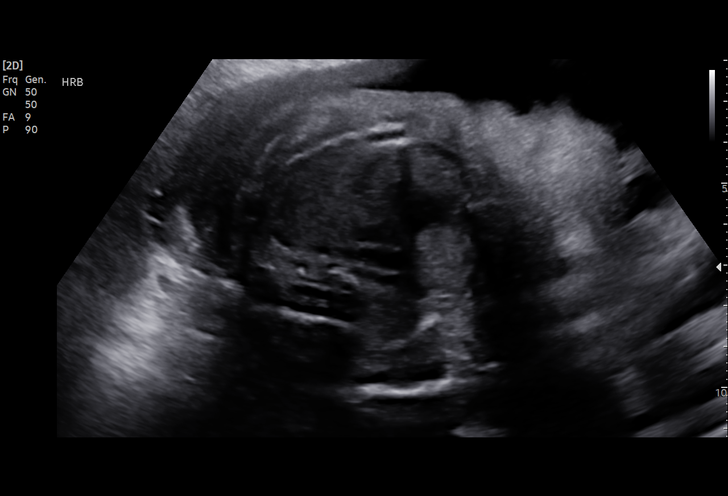

[14 of 28 positions shown; findings below may reference images not displayed]

[REDACTED]

 2  US MFM UA CORD DOPPLER                76820.02    KACARIBU

Indications

 Maternal care for known or suspected poor
 fetal growth, third trimester, not applicable or
 unspecified IUGR
 Antenatal screening for malformations
 31 weeks gestation of pregnancy
Vital Signs

 BMI:
Fetal Evaluation

 Num Of Fetuses:         1
 Fetal Heart Rate(bpm):  133
 Cardiac Activity:       Observed
 Presentation:           Cephalic
 Placenta:               Posterior
 P. Cord Insertion:      Previously Visualized

 Amniotic Fluid
 AFI FV:      Within normal limits

 AFI Sum(cm)     %Tile       Largest Pocket(cm)
 16.1            58

 RUQ(cm)       RLQ(cm)       LUQ(cm)        LLQ(cm)

Biophysical Evaluation
 Amniotic F.V:   Within normal limits       F. Tone:        Observed
 F. Movement:    Observed                   Score:          [DATE]
 F. Breathing:   Observed
Gestational Age

 LMP:           31w 0d        Date:  05/28/19                 EDD:   03/03/20
 Best:          31w 0d     Det. By:  LMP  (05/28/19)          EDD:   03/03/20
Anatomy

 Cranium:               Appears normal         LVOT:                   Appears normal
 Cavum:                 Appears normal         Aortic Arch:            Appears normal
 Ventricles:            Appears normal         Ductal Arch:            Appears normal
 Choroid Plexus:        Appears normal         Diaphragm:              Appears normal
 Cerebellum:            Appears normal         Stomach:                Appears normal, left
                                                                       sided
 Posterior Fossa:       Appears normal         Abdomen:                Appears normal
 Nuchal Fold:           Not applicable (>20    Abdominal Wall:         Not well visualized
                        wks GA)
 Face:                  Appears normal         Cord Vessels:           Appears normal (3
                        (orbits and profile)                           vessel cord)
 Lips:                  Appears normal         Kidneys:                Appear normal
 Palate:                Appears normal         Bladder:                Appears normal
 Thoracic:              Appears normal         Spine:                  Appears normal
 Heart:                 Appears normal         Upper Extremities:      Appears normal
                        (4CH, axis, and
                        situs)
 RVOT:                  Appears normal         Lower Extremities:      Appears normal

 Other:  Heels/feet and hands visualized. Nasal bone visualized.
Doppler - Fetal Vessels

 Umbilical Artery
  S/D     %tile      RI    %tile                      PSV    ADFV    RDFV
                                                    (cm/s)
  2.63       42    0.[REDACTED]      No      No

Cervix Uterus Adnexa

 Cervix
 Not visualized (advanced GA >41wks)
Comments

 This patient was seen due to an IUGR fetus.  She denies any
 problems since her last exam.
 A biophysical profile performed today was [DATE].
 There was normal amniotic fluid noted on today's ultrasound
 exam.
 Doppler studies of the umbilical arteries performed due to
 fetal growth restriction showed a normal S/D ratio of 2.63.
 There were no signs of absent or reversed end-diastolic flow
 noted today.
 A follow-up exam was scheduled in 1 week.

## 2022-05-15 IMAGING — US US MFM FETAL BPP W/O NON-STRESS
1 series · 15 of 28 positions shown · non-contrast
Comparison: none

[Series 1: us mfm fetal bpp w/o non-stress · 30 acquisitions, 15 frames shown]
[im 1/30]
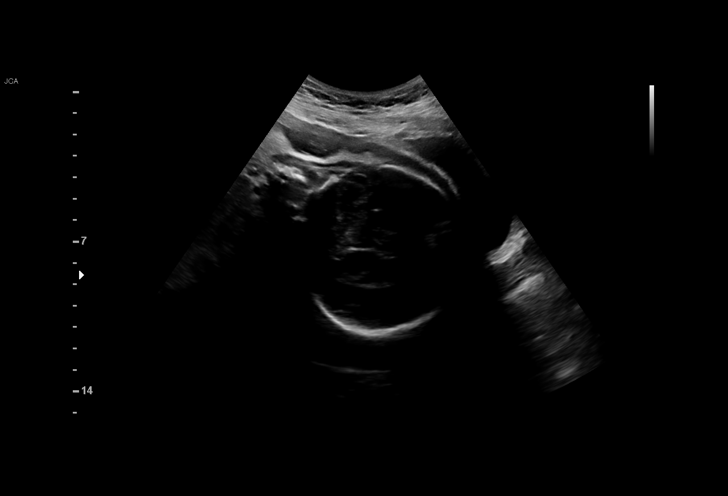
[im 3/30]
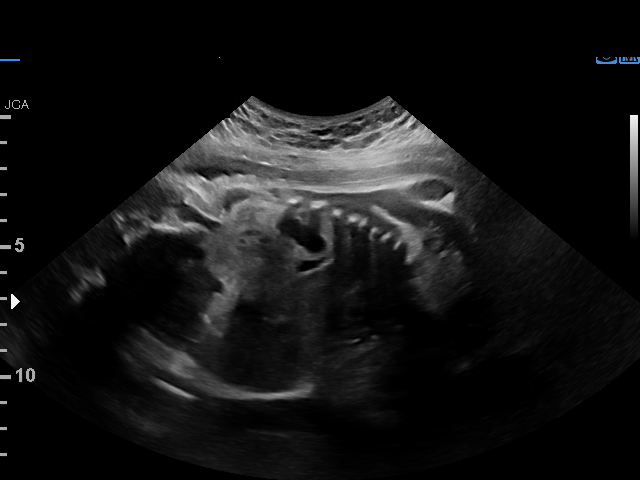
[im 5/30]
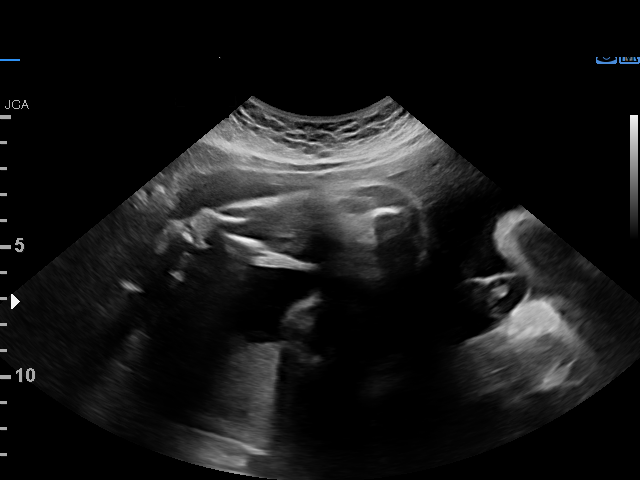
[im 7/30]
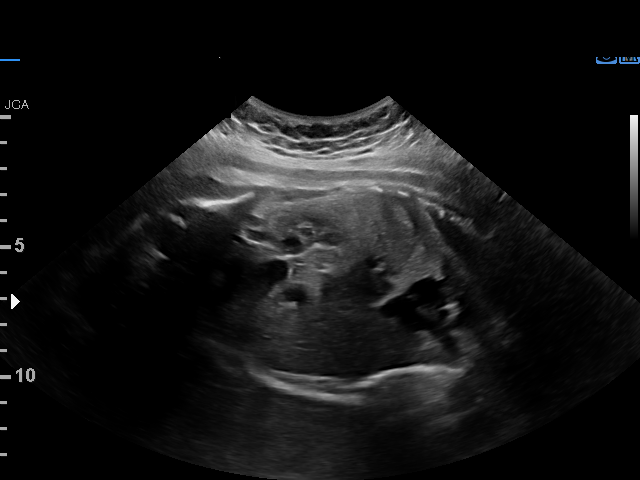
[im 9/30]
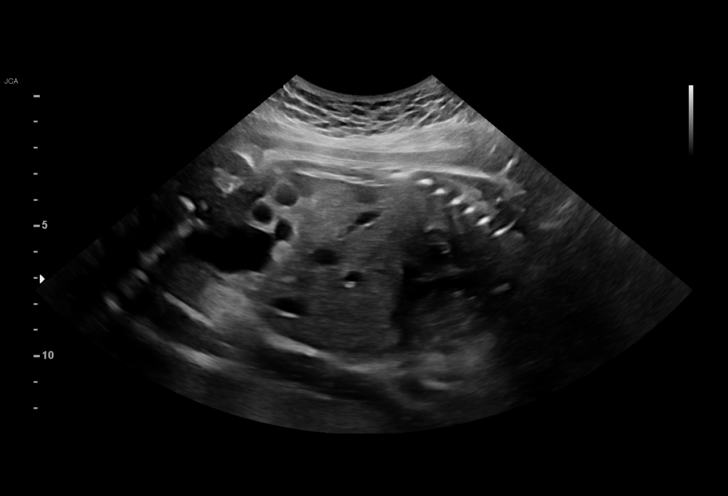
[im 11/30]
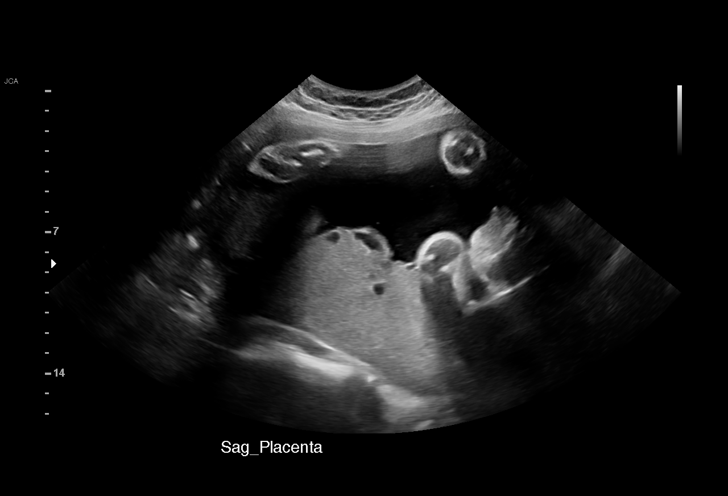
[im 13/30]
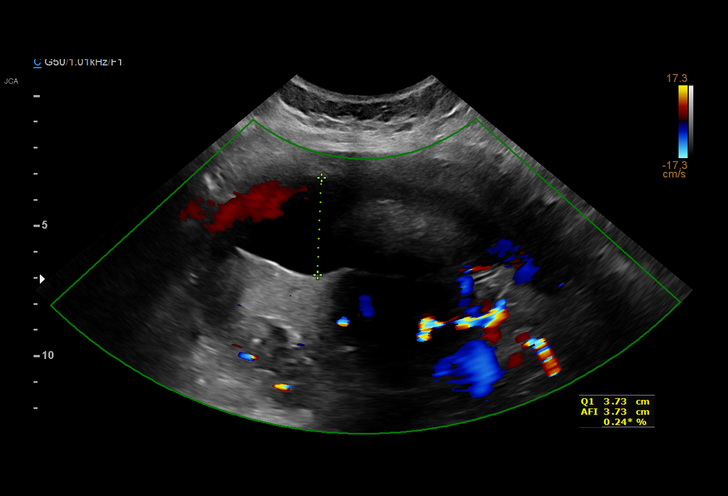
[im 16/30]
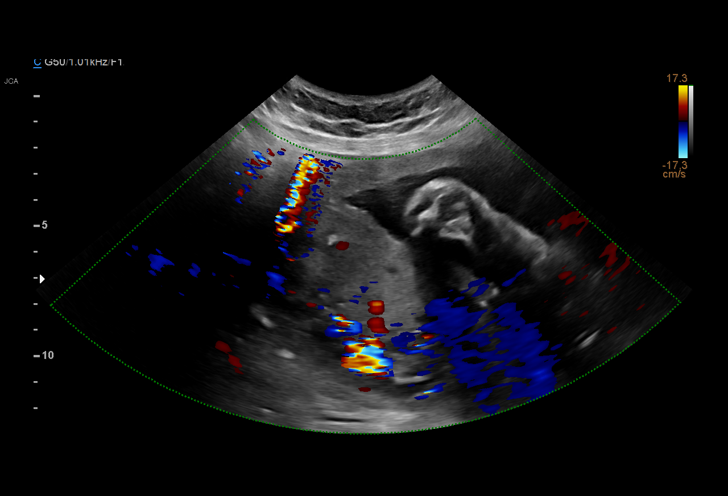
[im 17/30]
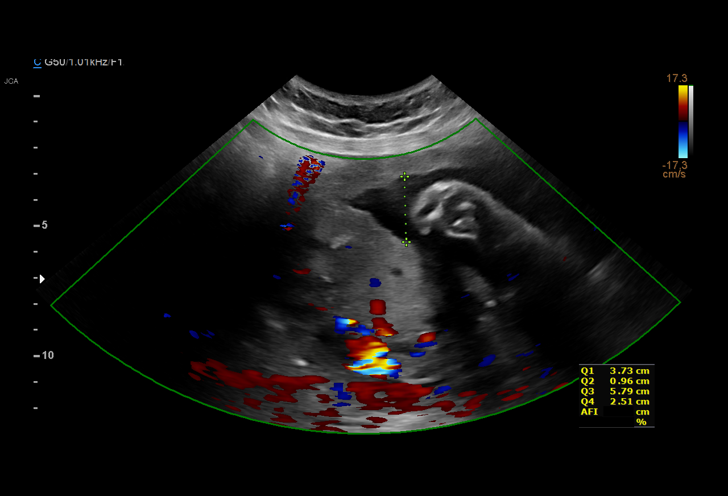
[im 19/30]
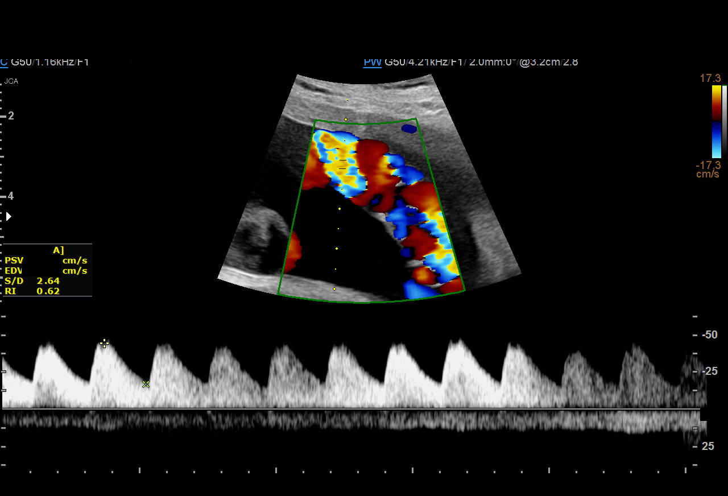
[im 21/30]
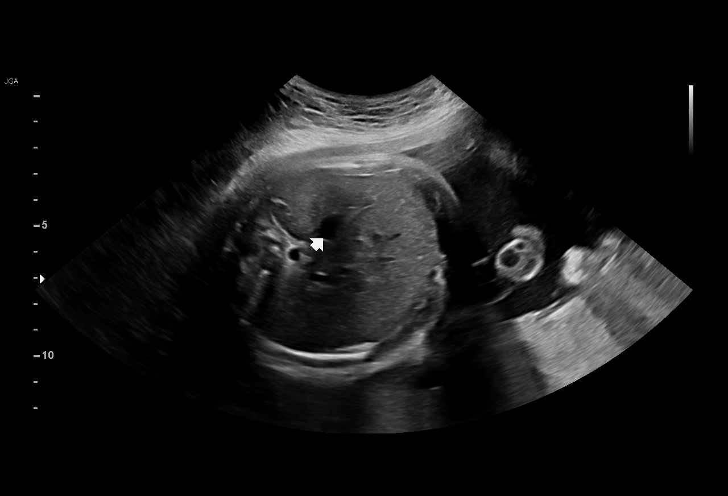
[im 23/30]
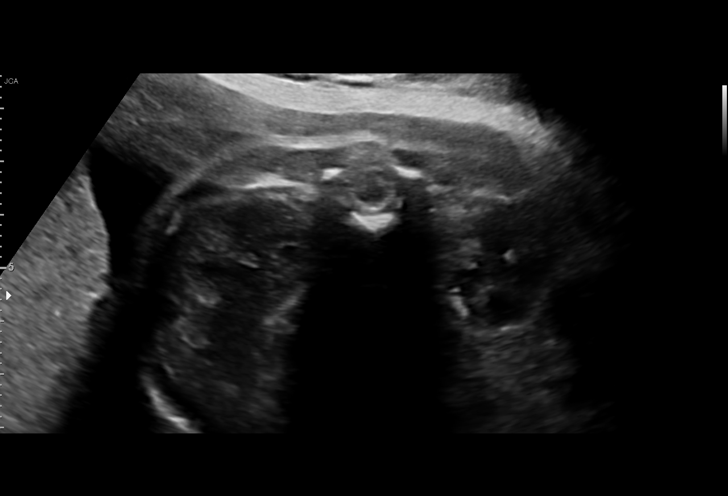
[im 25/30]
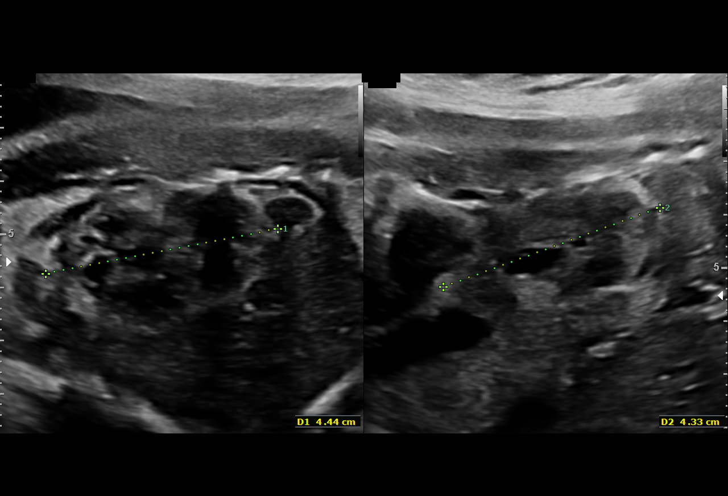
[im 27/30]
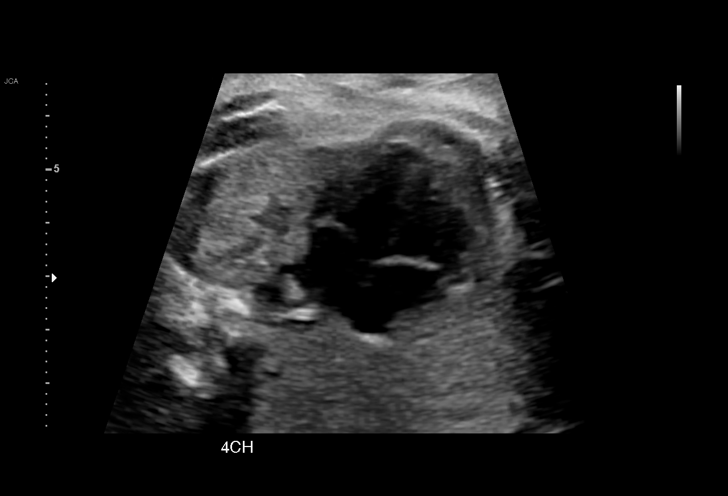
[im 30/30]
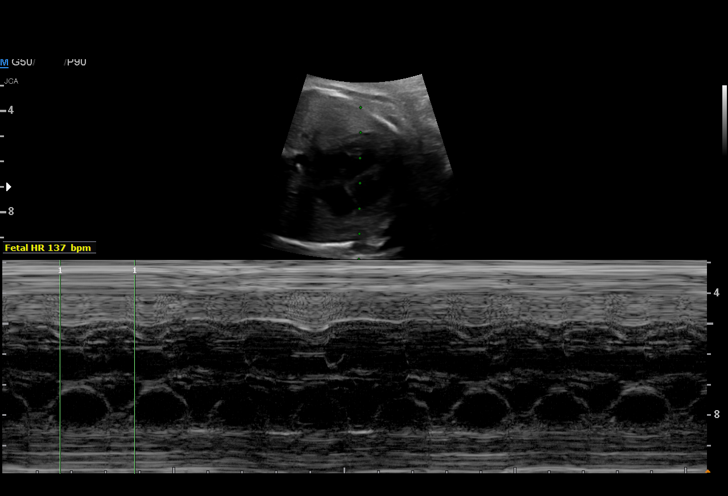

[15 of 28 positions shown; findings below may reference images not displayed]

[REDACTED]

Indications

 Maternal care for known or suspected poor
 fetal growth, third trimester, not applicable or
 unspecified IUGR
 34 weeks gestation of pregnancy
Vital Signs

                                                Height:        5'1"
Fetal Evaluation

 Num Of Fetuses:         1
 Fetal Heart Rate(bpm):  126
 Cardiac Activity:       Observed
 Presentation:           Cephalic
 Placenta:               Posterior
 P. Cord Insertion:      Previously Visualized

 Amniotic Fluid
 AFI FV:      Within normal limits

 AFI Sum(cm)     %Tile       Largest Pocket(cm)
 13              41

 RUQ(cm)       RLQ(cm)       LUQ(cm)        LLQ(cm)
 3.7           2.5           1
Biophysical Evaluation

 Amniotic F.V:   Within normal limits       F. Tone:        Observed
 F. Movement:    Observed                   Score:          [DATE]
 F. Breathing:   Observed
Gestational Age

 LMP:           34w 0d        Date:  05/28/19                 EDD:   03/03/20
 Best:          34w 0d     Det. By:  LMP  (05/28/19)          EDD:   03/03/20
Doppler - Fetal Vessels

 Umbilical Artery
  S/D     %tile                                              ADFV    RDFV
  2.79       65                                                 No      No

Comments

 This patient was seen due to a borderline IUGR fetus.  She
 denies any problems since her last exam.
 A biophysical profile performed today was [DATE].
 There was normal amniotic fluid noted on today's ultrasound
 exam.
 Doppler studies of the umbilical arteries performed due to
 fetal growth restriction showed a normal S/D ratio of 2.79.
 There were no signs of absent or reversed end-diastolic flow
 noted today.
 A follow-up exam was scheduled in 1 week.

## 2022-06-12 IMAGING — US US MFM FETAL BPP W/O NON-STRESS
1 series · 15 of 28 positions shown · non-contrast
Comparison: none

[Series 1: us mfm fetal bpp w/o non-stress · 32 acquisitions, 15 frames shown]
[im 1/32]
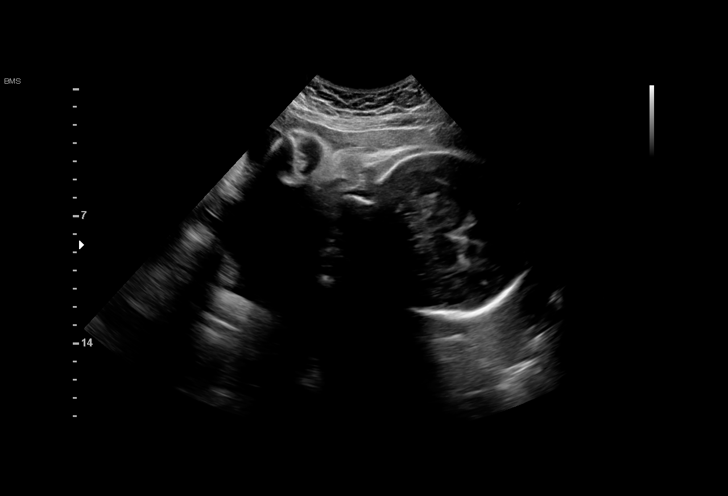
[im 3/32]
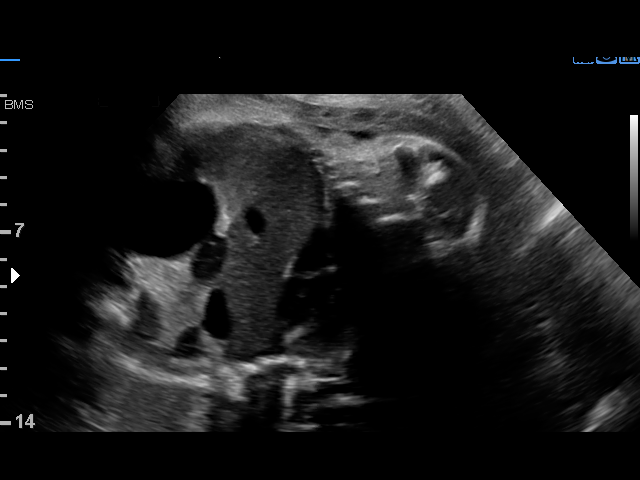
[im 5/32]
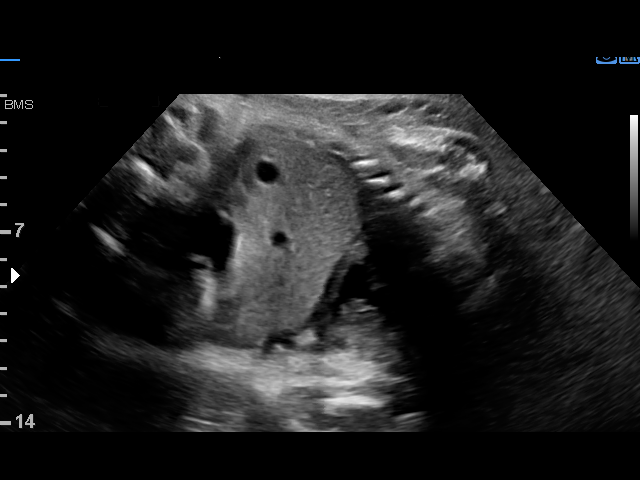
[im 7/32]
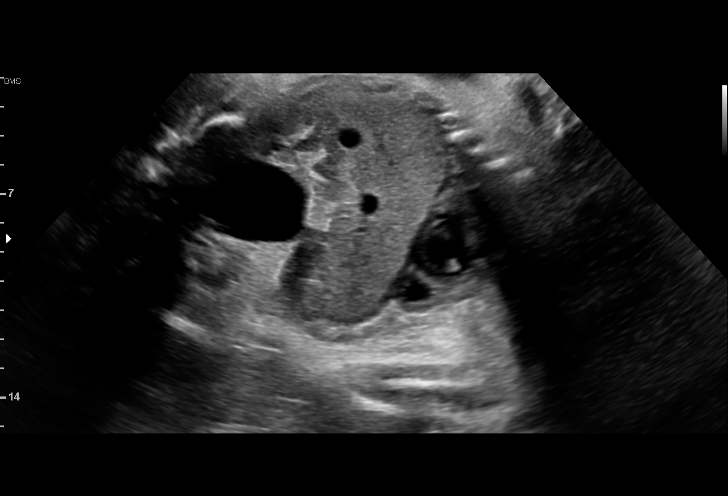
[im 10/32]
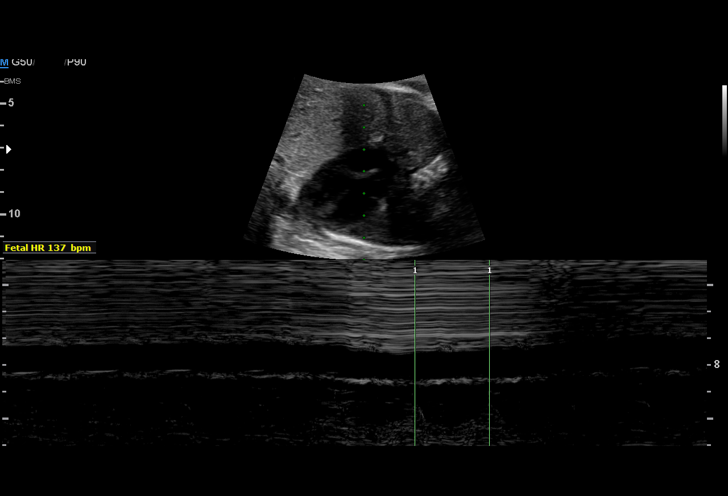
[im 12/32]
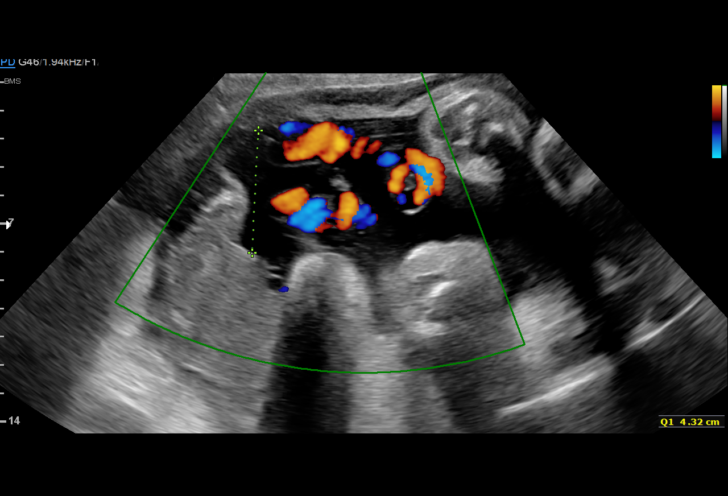
[im 14/32]
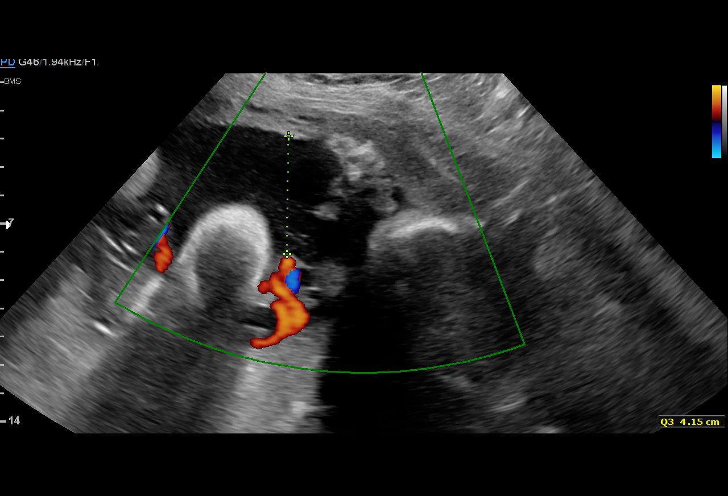
[im 17/32]
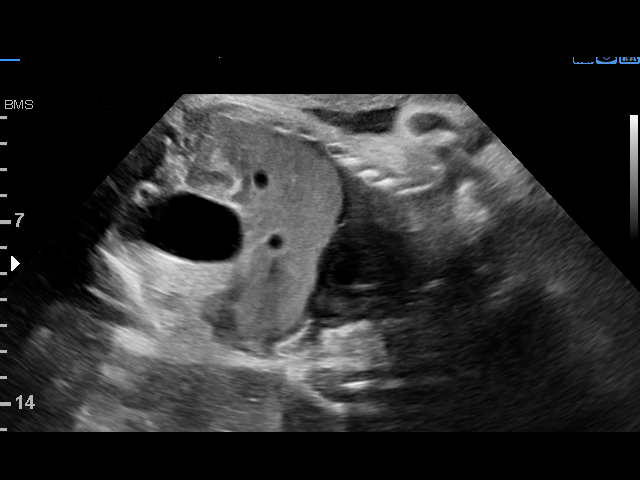
[im 18/32]
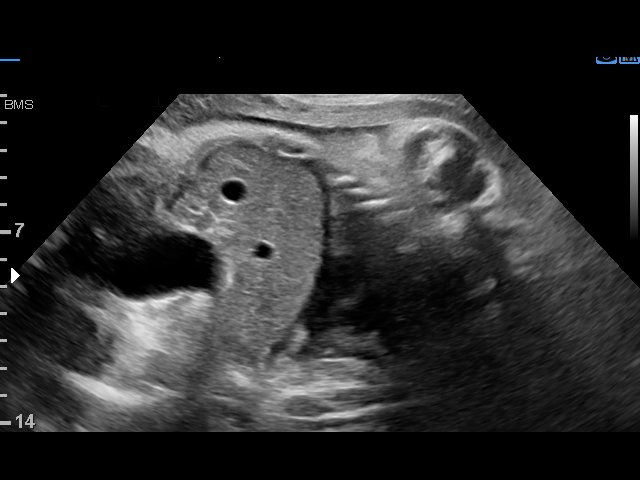
[im 20/32]
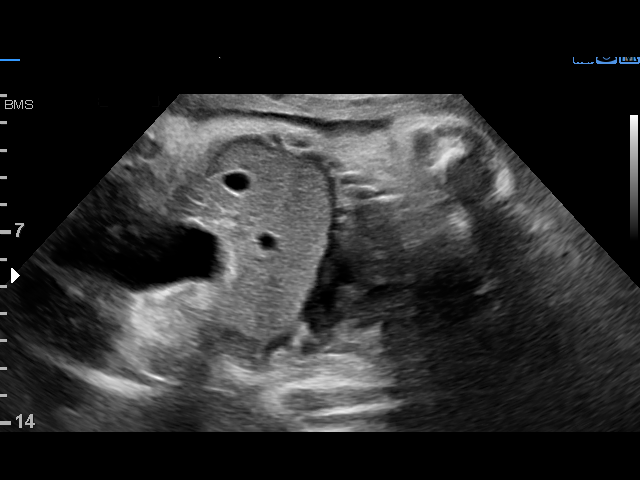
[im 22/32]
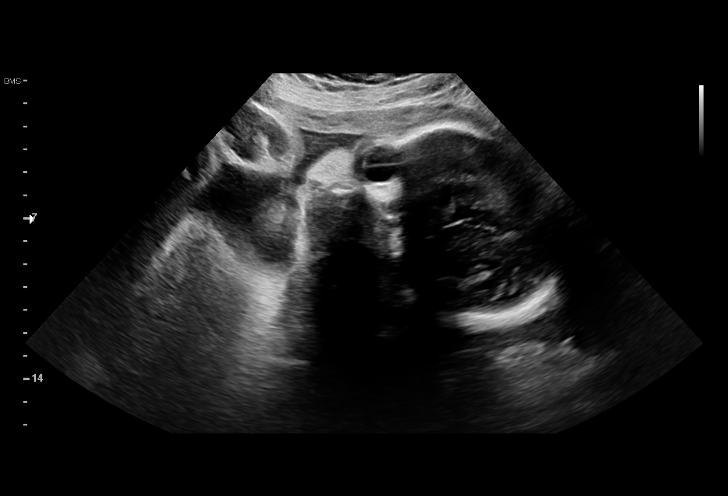
[im 25/32]
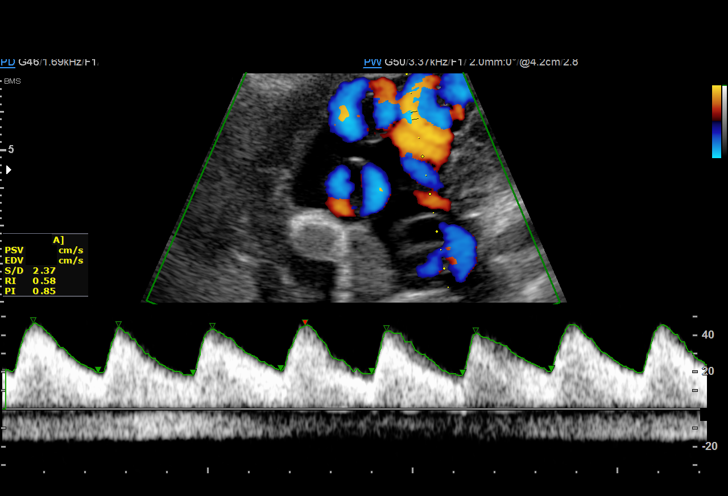
[im 27/32]
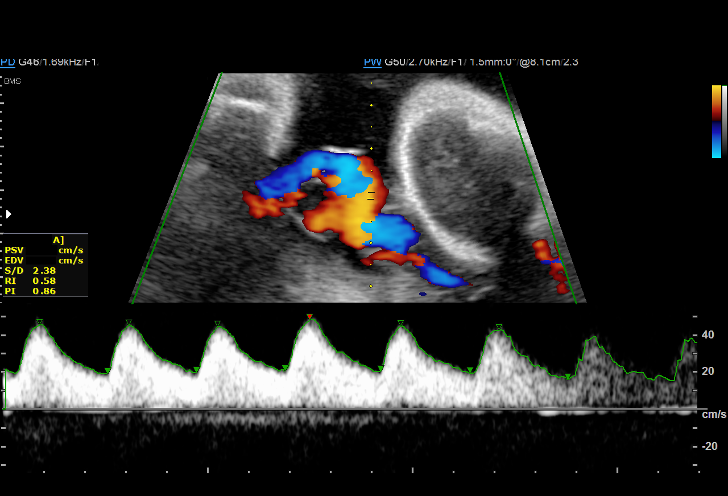
[im 29/32]
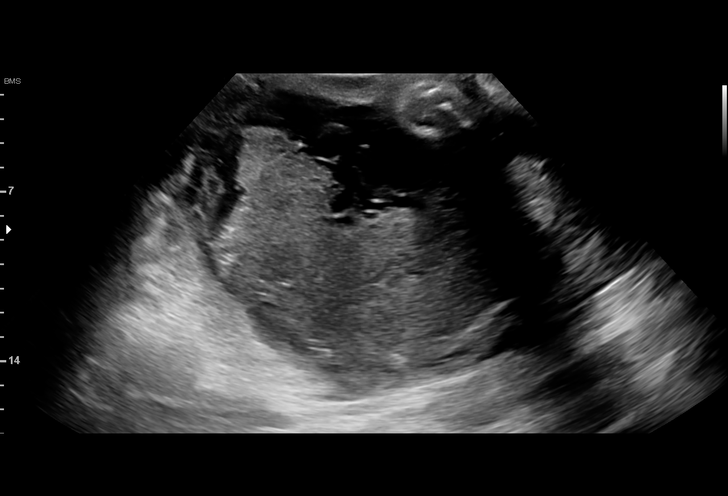
[im 32/32]
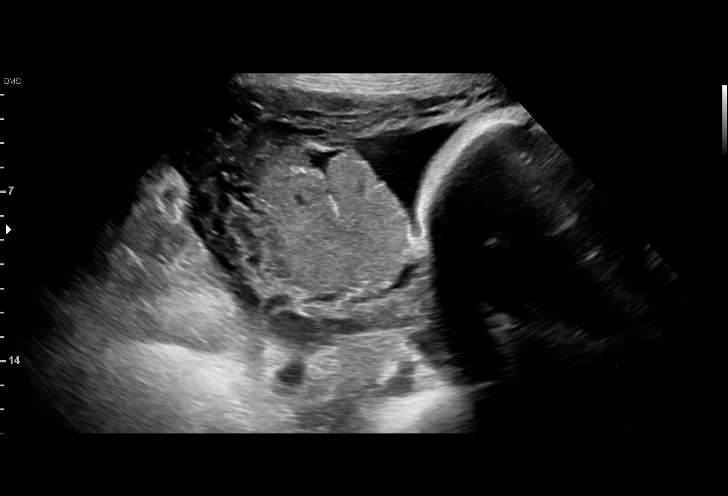

[15 of 28 positions shown; findings below may reference images not displayed]

[REDACTED]

Indications

 38 weeks gestation of pregnancy
 Maternal care for known or suspected poor
 fetal growth, third trimester, not applicable or
 unspecified IUGR
Vital Signs

                                                Height:        5'1"
Fetal Evaluation

 Num Of Fetuses:         1
 Fetal Heart Rate(bpm):  137
 Cardiac Activity:       Observed
 Presentation:           Cephalic
 Placenta:               Posterior
 P. Cord Insertion:      Previously Visualized

 Amniotic Fluid
 AFI FV:      Within normal limits

 AFI Sum(cm)     %Tile       Largest Pocket(cm)
 10.68           31

 RUQ(cm)                     LUQ(cm)        LLQ(cm)

Biophysical Evaluation

 Amniotic F.V:   Pocket => 2 cm             F. Tone:        Observed
 F. Movement:    Observed                   Score:          [DATE]
 F. Breathing:   Observed
Biometry

 LV:          4  mm
Gestational Age

 LMP:           38w 0d        Date:  05/28/19                 EDD:   03/03/20
 Best:          38w 0d     Det. By:  LMP  (05/28/19)          EDD:   03/03/20
Anatomy

 Ventricles:            Appears normal         Kidneys:                Appear normal
 Diaphragm:             Appears normal         Bladder:                Appears normal
 Stomach:               Appears normal, left
                        sided
Doppler - Fetal Vessels

 Umbilical Artery
  S/D     %tile      RI    %tile                             ADFV    RDFV
   3.2       92    0.69       94                                No      No

Cervix Uterus Adnexa

 Cervix
 Not visualized (advanced GA >52wks)
Impression

 Patient return for antenatal testing.  Previously seen fetal
 growth restriction resolved.
 Amniotic fluid is normal and good fetal activity is seen
 .Antenatal testing is reassuring. BPP [DATE].

 Patient has a scheduled induction at 39 weeks gestation.
Recommendations

 Follow-up scans as clinically indicated.
                 LISSETH KATHERINE

## 2023-06-25 ENCOUNTER — Encounter (HOSPITAL_COMMUNITY): Payer: Self-pay

## 2023-06-25 ENCOUNTER — Emergency Department (HOSPITAL_COMMUNITY)
Admission: EM | Admit: 2023-06-25 | Discharge: 2023-06-25 | Disposition: A | Discharge status: Home / Self Care | Payer: Self-pay | Attending: Emergency Medicine | Admitting: Emergency Medicine

## 2023-06-25 ENCOUNTER — Other Ambulatory Visit: Payer: Self-pay

## 2023-06-25 ENCOUNTER — Emergency Department (HOSPITAL_COMMUNITY): Payer: Self-pay

## 2023-06-25 DIAGNOSIS — R569 Unspecified convulsions: Secondary | ICD-10-CM | POA: Insufficient documentation

## 2023-06-25 DIAGNOSIS — G40009 Localization-related (focal) (partial) idiopathic epilepsy and epileptic syndromes with seizures of localized onset, not intractable, without status epilepticus: Secondary | ICD-10-CM

## 2023-06-25 HISTORY — DX: Localization-related (focal) (partial) idiopathic epilepsy and epileptic syndromes with seizures of localized onset, not intractable, without status epilepticus: G40.009

## 2023-06-25 LAB — BASIC METABOLIC PANEL
Anion gap: 7 (ref 5–15)
BUN: 8 mg/dL (ref 6–20)
CO2: 19 mmol/L — ABNORMAL LOW (ref 22–32)
Calcium: 8.6 mg/dL — ABNORMAL LOW (ref 8.9–10.3)
Chloride: 110 mmol/L (ref 98–111)
Creatinine, Ser: 0.94 mg/dL (ref 0.44–1.00)
GFR, Estimated: >60 mL/min (ref >60)
Glucose, Bld: 121 mg/dL — ABNORMAL HIGH (ref 70–99)
Potassium: 3.9 mmol/L (ref 3.5–5.1)
Sodium: 136 mmol/L (ref 135–145)

## 2023-06-25 LAB — RAPID URINE DRUG SCREEN, HOSP PERFORMED
Amphetamines: NOT DETECTED
Barbiturates: NOT DETECTED
Benzodiazepines: NOT DETECTED
Cocaine: NOT DETECTED
Opiates: NOT DETECTED
Tetrahydrocannabinol: NOT DETECTED

## 2023-06-25 LAB — CBC
HCT: 39 % (ref 36.0–46.0)
Hemoglobin: 12.9 g/dL (ref 12.0–15.0)
MCH: 27.6 pg (ref 26.0–34.0)
MCHC: 33.1 g/dL (ref 30.0–36.0)
MCV: 83.5 fL (ref 80.0–100.0)
Platelets: 199 10*3/uL (ref 150–400)
RBC: 4.67 MIL/uL (ref 3.87–5.11)
RDW: 13.2 % (ref 11.5–15.5)
WBC: 5.1 10*3/uL (ref 4.0–10.5)
nRBC: 0 % (ref 0.0–0.2)

## 2023-06-25 LAB — ETHANOL: Alcohol, Ethyl (B): <10 mg/dL (ref <10)

## 2023-06-25 LAB — HCG, SERUM, QUALITATIVE: Preg, Serum: NEGATIVE

## 2023-06-25 LAB — MAGNESIUM: Magnesium: 2 mg/dL (ref 1.7–2.4)

## 2023-06-25 MED ORDER — LEVETIRACETAM 500 MG PO TABS: 500.0000 mg | ORAL_TABLET | Freq: Two times a day (BID) | ORAL | 0 refills | Status: DC

## 2023-06-25 MED ORDER — ACETAMINOPHEN 325 MG PO TABS
650.0000 mg | ORAL_TABLET | Freq: Once | ORAL | Status: AC
  Administered 2023-06-25: 650 mg via ORAL
  Filled 2023-06-25: qty 2

## 2023-06-25 NOTE — Discharge Instructions (Addendum)
Como se discuti, sus anlisis de laboratorio y sus imgenes son tranquilizadores. He enviado una derivacin a Software engineer, lo llamarn en los [employer] para programar una cita para una evaluacin ms detallada de su actividad similar a una convulsin. No conduzca hasta que Neurologa pueda evaluarlo ms a fondo para asegurarse de que no tiene una convulsin similar a Science writer.   Solicite ayuda de inmediato si: Usted tiene o alguien ve que usted tiene: Una convulsin que dura ms de 5 minutos. Son [employer] las convulsiones seguidas y no se siente mejor entre una y [place]. Una convulsin que le dificulta la respiracin. Una convulsin que lo deja sin poder hablar ni usar una parte del cuerpo. No se despierta inmediatamente despus de tener una convulsin. Se lesiona durante una convulsin. Tiene confusin o dolor justo inmediatamente despus de una convulsin.

## 2023-06-25 NOTE — ED Provider Notes (Signed)
[place] EMERGENCY DEPARTMENT AT [facility] Provider Note   CSN: [id number] Arrival date & time: 06/25/23  5621     History  Chief Complaint  Patient presents with   Seizures    Emily [name] Arville Lime is a 37 y.o. female with no significant past medical history presents the ED today for possible seizure.  Patient states that her husband woke her up this morning after having seizure-like activity while sleeping this morning. She was reported to be shaking in bed and confused when he woke her up. Patient denies history of seizures in the past, alcohol use, or substance use.   On evaluation, patient states that she is feeling fine.  No headaches, weakness, or fatigue.     Home Medications Prior to Admission medications   Medication Sig Start Date End Date Taking? Authorizing Provider  DM-Doxylamine-Acetaminophen (NYQUIL COLD & FLU PO) Take 2 capsules by mouth every 6 (six) hours as needed (Cough/Cold).   Yes [provider]      Allergies    Patient has no known allergies.    Review of Systems   Review of Systems  Neurological:  Positive for seizures.  All other systems reviewed and are negative.   Physical Exam Updated Vital Signs BP 114/73   Pulse 82   Temp 97.8 F (36.6 C)   Resp 19   Ht 5\' 1"  (1.549 m)   Wt 83.7 kg   LMP 06/11/2023   SpO2 99%   BMI 34.87 kg/m  Physical Exam Vitals and nursing note reviewed.  Constitutional:      General: She is not in acute distress.    Appearance: Normal appearance.  HENT:     Head: Normocephalic and atraumatic.     Mouth/Throat:     Mouth: Mucous membranes are moist.     Pharynx: Oropharynx is clear.  Eyes:     Conjunctiva/sclera: Conjunctivae normal.     Pupils: Pupils are equal, round, and reactive to light.  Cardiovascular:     Rate and Rhythm: Normal rate and regular rhythm.     Pulses: Normal pulses.     Heart sounds: Normal heart sounds.  Pulmonary:     Effort: Pulmonary effort  is normal.     Breath sounds: Normal breath sounds.  Abdominal:     Palpations: Abdomen is soft.     Tenderness: There is no abdominal tenderness.  Musculoskeletal:        General: No tenderness. Normal range of motion.     Cervical back: Normal range of motion.  Skin:    General: Skin is warm and dry.     Findings: No rash.  Neurological:     General: No focal deficit present.     Mental Status: She is alert.     Sensory: No sensory deficit.     Motor: No weakness.  Psychiatric:        Mood and Affect: Mood normal.        Behavior: Behavior normal.    ED Results / Procedures / Treatments   Labs (all labs ordered are listed, but only abnormal results are displayed) Labs Reviewed  BASIC METABOLIC PANEL - Abnormal; Notable for the following components:      Result Value   CO2 19 (*)    Glucose, Bld 121 (*)    Calcium 8.6 (*)    All other components within normal limits  HCG, SERUM, QUALITATIVE  CBC  RAPID URINE DRUG SCREEN, HOSP PERFORMED  MAGNESIUM  ETHANOL  CBG MONITORING, ED    EKG None  Radiology CT Head Wo Contrast  Result Date: 06/25/2023 CLINICAL DATA:  Seizure, new-onset, no history of trauma EXAM: CT HEAD WITHOUT CONTRAST TECHNIQUE: Contiguous axial images were obtained from the base of the skull through the vertex without intravenous contrast. RADIATION DOSE REDUCTION: This exam was performed according to the departmental dose-optimization program which includes automated exposure control, adjustment of the mA and/or kV according to patient size and/or use of iterative reconstruction technique. COMPARISON:  None Available. FINDINGS: Brain: No hemorrhage. No hydrocephalus. No extra-axial fluid collection. No CT evidence of acute cortical infarct. No mass effect. No mass lesion. Vascular: No hyperdense vessel or unexpected calcification. Skull: Normal. Negative for fracture or focal lesion. Sinuses/Orbits: No middle ear or mastoid effusion. Mucosal thickening left  maxillary sinus. Orbits are unremarkable. Other: None. IMPRESSION: No acute intracranial abnormality. Electronically Signed   By: Lorenza Cambridge M.D.   On: 06/25/2023 13:21    Procedures Procedures: not indicated.   Medications Ordered in ED Medications  acetaminophen (TYLENOL) tablet 650 mg (has no administration in time range)    ED Course/ Medical Decision Making/ A&P                                 Medical Decision Making Amount and/or Complexity of Data Reviewed Labs: ordered. Radiology: ordered.   This patient presents to the ED for concern of seizure-like activity, this involves an extensive number of treatment options, and is a complaint that carries with it a high risk of complications and morbidity.   Differential diagnosis includes: Seizure, pseudoseizure, hypoglycemia, electrolyte derangement, brain tumor, etc.   Comorbidities  No significant past medical history   Additional History  Additional history obtained from prior records.   Lab Tests  I ordered and personally interpreted labs.  The pertinent results include:   Negative qualitative hCG BMP and CBC within normal limits - no acute electrolyte derangement, AKI, infection, or anemia Ethanol is less than 10 Negative UDS   Imaging Studies  I ordered imaging studies including CT head  I independently visualized and interpreted imaging which showed: no acute intracranial abnormalities. I agree with the radiologist interpretation   Problem List / ED Course / Critical Interventions / Medication Management  Seizure-like activity I have reviewed the patients home medicines and have made adjustments as needed Discussed findings with patient.  All questions were answered.  She states that she has a headache at this time of reevaluation.  I ordered Tylenol for her to get prior to discharge.   Social Determinants of Health  Language barrier   Test / Admission - Considered  Discussed findings with  patient.  All questions were answered. She is hemodynamically stable and safe for discharge home. Ambulatory referral for neurology sent. Return precautions provided.       Final Clinical Impression(s) / ED Diagnoses Final diagnoses:  Seizure-like activity (HCC)    Rx / DC Orders ED Discharge Orders          Ordered    Ambulatory referral to Neurology       Comments: An appointment is requested in approximately: 1 week   06/25/23 1407    levETIRAcetam (KEPPRA) 500 MG tablet  2 times daily,   Status:  Discontinued        06/25/23 1407              Maxwell Marion, PA-C  06/25/23 1425    Margarita Grizzle, MD 06/25/23 1450

## 2023-06-25 NOTE — ED Triage Notes (Signed)
Pt  BIB [facility] d/t possible seizure.  Pt was lying in bed sleeping and woke husband up d/t shaking like what sounds like tonic clonic sz.  Pt was confused A&O x3 now. Still does not know year. No hx of seizures.

## 2023-08-05 ENCOUNTER — Ambulatory Visit: Payer: Self-pay | Admitting: Internal Medicine

## 2023-08-05 ENCOUNTER — Encounter: Payer: Self-pay | Admitting: Internal Medicine

## 2023-08-05 ENCOUNTER — Telehealth: Payer: Self-pay | Admitting: Neurology

## 2023-08-05 VITALS — BP 122/72 | HR 85 | Resp 16 | Ht 61.0 in | Wt 159.0 lb

## 2023-08-05 DIAGNOSIS — R569 Unspecified convulsions: Secondary | ICD-10-CM

## 2023-08-05 DIAGNOSIS — D179 Benign lipomatous neoplasm, unspecified: Secondary | ICD-10-CM

## 2023-08-05 DIAGNOSIS — N644 Mastodynia: Secondary | ICD-10-CM

## 2023-08-05 LAB — POCT URINE PREGNANCY: Preg Test, Ur: NEGATIVE

## 2023-08-05 MED ORDER — LEVETIRACETAM 500 MG PO TABS: 500.0000 mg | ORAL_TABLET | Freq: Two times a day (BID) | ORAL | 1 refills | Status: DC

## 2023-08-05 NOTE — Progress Notes (Signed)
Subjective:    Patient ID: Emily [name], female   DOB: 1985/07/26, 38 y.o.   MRN: [id number]   HPI  Here to establish  Emily [name] interprets   Pain in bilateral breasts for 3 weeks.  Describes constant discomfort with occasional pulsatile pain additionally.  No redness, swelling.  No nipple discharge.  She has never had breast tenderness associated with her periods.  She does not feel as when she has been pregnant in past.   LMP was 07/05/2023, so is due.  Periods are regular.   She does have and IUD, placed at [facility] in 2022.  States 10 year version.  She states the IUD strings are in place and she checks monthly.   She has never had a mammogram for any reason in past.    One cup of coffee daily Occasional Coke. No chocolate.    2.  First and only seizure on December 11.2024:  Husband awakened to her shaking all over while sleeping. No urine or stool incontinence.  No biting tongue or cheek.  He thinks she had shaking for about 5 minutes and then was confused for a while afterward. She states she was dealing with a cold and had been taking Nyquil at bedtime.  She took the Nyquil at 9 p.m. and then took Theraflu 1 hour later as she could not fall asleep.   She uses alcohol only occasionally and had no use in days prior to episode.   CBC, CMP, UDS, magnesium, Ethanol and CT of brain were all normal or negative.  Serum HCG negative.   Has an appt with Neurology, [facility] on March 11 in follow up. .   3.   By the way:  long term and multiple lesions under skin on arms.  Enlarging somewhat with time.  Current Meds  Medication Sig   levETIRAcetam (KEPPRA) 500 MG tablet Take 500 mg by mouth 2 (two) times daily.   No Known Allergies  Past Medical History:  Diagnosis Date   Medical history non-contributory    Past Surgical History:  Procedure Laterality Date   NO PAST SURGERIES     Family History  Problem Relation Age of Onset   Diabetes Father    Family  Status  Relation Name Status   Mother  Alive, age 76y   Father  Alive, age 76y   Brother  Alive   Brother  Alive   Brother  Alive   Brother  Alive   Daughter Art therapist, age 7y   Son Rosario Jacks, age 9y   Son Education officer, community, age 3y  No partnership data on file   Social History   Socioeconomic History   Marital status: Married    Spouse name: Renaldo Fiddler   Number of children: 3   Years of education: 12   Highest education level: 12th grade  Occupational History   Occupation: Housewife  Tobacco Use   Smoking status: Never    Passive exposure: Never   Smokeless tobacco: Never  Vaping Use   Vaping status: Never Used  Substance and Sexual Activity   Alcohol use: Yes    Comment: Occasionl   Drug use: No   Sexual activity: Yes    Birth control/protection: I.U.D.  Other Topics Concern   Not on file  Social History Narrative   Lives at home with husband and 3 children   Social Drivers of Health   Financial Resource Strain: Low Risk  (08/05/2023)   Overall Financial  Resource Strain (CARDIA)    Difficulty of Paying Living Expenses: Not very hard  Food Insecurity: No Food Insecurity (08/05/2023)   Hunger Vital Sign    Worried About Running Out of Food in the Last Year: Never true    Ran Out of Food in the Last Year: Never true  Transportation Needs: No Transportation Needs (08/05/2023)   PRAPARE - Administrator, Civil Service (Medical): No    Lack of Transportation (Non-Medical): No  Physical Activity: Not on file  Stress: Not on file  Social Connections: Not on file  Intimate Partner Violence: Not At Risk (08/05/2023)   Humiliation, Afraid, Rape, and Kick questionnaire    Fear of Current or Ex-Partner: No    Emotionally Abused: No    Physically Abused: No    Sexually Abused: No       Review of Systems    Objective:   BP 122/72 (BP Location: Right Arm, Patient Position: Sitting, Cuff Size: Normal)   Pulse 85   Resp 16   Ht 5\' 1"  (1.549 m)   Wt  159 lb (72.1 kg)   LMP 07/05/2023   BMI 30.04 kg/m   Physical Exam HENT:     Head: Normocephalic and atraumatic.     Right Ear: Tympanic membrane, ear canal and external ear normal.     Left Ear: Tympanic membrane, ear canal and external ear normal.     Nose: Nose normal.     Mouth/Throat:     Mouth: Mucous membranes are moist.     Pharynx: Oropharynx is clear.  Eyes:     Extraocular Movements: Extraocular movements intact.     Conjunctiva/sclera: Conjunctivae normal.     Pupils: Pupils are equal, round, and reactive to light.  Neck:     Thyroid: No thyroid mass or thyromegaly.  Cardiovascular:     Rate and Rhythm: Normal rate and regular rhythm.     Pulses: Normal pulses.     Heart sounds: S1 normal and S2 normal. No murmur heard.    No friction rub. No S3 or S4 sounds.  Pulmonary:     Effort: Pulmonary effort is normal.     Breath sounds: Normal breath sounds.  Chest:  Breasts:    Right: No inverted nipple, mass, nipple discharge, skin change or tenderness.     Left: No inverted nipple, mass, nipple discharge, skin change or tenderness.  Abdominal:     General: Bowel sounds are normal.     Palpations: Abdomen is soft. There is no hepatomegaly, splenomegaly or mass.     Tenderness: There is no abdominal tenderness.  Musculoskeletal:     Cervical back: Normal range of motion and neck supple.     Right lower leg: No edema.     Left lower leg: No edema.  Lymphadenopathy:     Head:     Right side of head: No submental or submandibular adenopathy.     Left side of head: No submental or submandibular adenopathy.     Cervical: No cervical adenopathy.     Upper Body:     Right upper body: No supraclavicular or axillary adenopathy.     Left upper body: No supraclavicular or axillary adenopathy.  Skin:    General: Skin is warm.     Findings: Lesion (multiple subcutaneous soft tissue masses scattered on forearms and one on inner right upper arm.  NT.  Largest about 2 cm.)  present.  Neurological:     General: No  focal deficit present.     Mental Status: She is alert and oriented to person, place, and time.     Cranial Nerves: Cranial nerves 2-12 are intact.     Sensory: Sensation is intact.     Motor: Motor function is intact.     Coordination: Coordination is intact.     Gait: Gait is intact.     Deep Tendon Reflexes: Reflexes are normal and symmetric.  Psychiatric:        Behavior: Behavior normal.     Assessment & Plan   Bilateral breast tenderness:  No concerning findings on exam.  Will send for mammogram/US with [facility].  2.  Seizure:  one episode last month.  Has been on Keppra since.  No definitive trigger.  Wondering if combination of illness (not clear if fever) and taking cold remedies with overlapping meds:  tylenol, Dextromethorphan, and antihistamines:  doxylamine and Chlorpheniramine decreased seizure threshold.   Discussed she should not be driving until gets clearance from Neurology for both her safety and other.   Checking with Dr. Teresa Coombs, [facility], whom she will be seeing 09/23/23 if okay to stop Keppra for now and see what happens or if should continue until seen there.   Checking with patient to look at cold remedies to see what she was dosing the night of the seizure.  3.  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  Discussed can have removed, but will likely have some scarring.

## 2023-08-05 NOTE — Telephone Encounter (Signed)
CALLED [facility] where she was seen as new pt and the md refilled keppra but wanted to know if the neurologist wanted to have her continue it. I advised to continue as prescribed until seen by provider but I would run it by our provider prior to visit and make sure they would like the pt to stay continuing it

## 2023-08-05 NOTE — Telephone Encounter (Signed)
Possible seizure out of sleep. Yes, please continue Keppra until seen by Neurologist. Thanks

## 2023-08-05 NOTE — Telephone Encounter (Signed)
Suel with [facility] wanting to clarify if pt can stop taking levETIRAcetam (KEPPRA) 500 MG tablet that she was prescribed at the ER on 06/25/2023. States pt feels she no longer needs to take it. Requesting call back to discuss [phone]

## 2023-08-06 NOTE — Telephone Encounter (Signed)
I called [facility] back and spoke with Suel. I relayed per Dr Teresa Coombs that patient had possible seizure out of sleep and he advises the patient to continue Keppra until seen by Neurology which will be on 09/23/23 at 8:45 AM. He thanked me for the information and said he would relay this to the doctor.

## 2023-09-17 ENCOUNTER — Encounter: Payer: Self-pay | Admitting: Radiology

## 2023-09-23 ENCOUNTER — Ambulatory Visit: Payer: Self-pay | Admitting: Neurology

## 2023-09-23 ENCOUNTER — Encounter: Payer: Self-pay | Admitting: Neurology

## 2023-10-14 ENCOUNTER — Telehealth: Payer: Self-pay

## 2023-10-14 NOTE — Telephone Encounter (Signed)
 Patient called because she missed her appointment with neurology 09/23/23. Patient has been rescheduled in June. Patient wants to know if she can be referred somewhere else to be seen sooner. Please advise.

## 2023-10-20 NOTE — Telephone Encounter (Signed)
 Why did she miss the appointment?

## 2023-10-20 NOTE — Telephone Encounter (Signed)
 Patient missed appointment because her daughter was ill so she stayed home to care for her.

## 2023-11-03 NOTE — Telephone Encounter (Signed)
 We will not be able to get her in sooner.   Also, if she needs to cancel in emergency, she should make sure she calls the specialist and cancels before the appt., even if the day of

## 2023-11-06 NOTE — Telephone Encounter (Signed)
 Patient has been notified that we will not be able to get her in sooner.

## 2023-11-11 ENCOUNTER — Telehealth: Payer: Self-pay

## 2023-11-11 NOTE — Telephone Encounter (Signed)
 Patient needs an appointment for pain/ burning on lower back and feels a bump on the right side .   Patient has been having pain/ burning for the past two weeks.  Patient has not taken medication for problem.    We will call patient when we have a cancellation.

## 2023-11-12 NOTE — Telephone Encounter (Signed)
 Lmtrc with interpreter [phone]

## 2023-11-17 ENCOUNTER — Encounter: Payer: Self-pay | Admitting: Internal Medicine

## 2023-11-17 ENCOUNTER — Ambulatory Visit: Payer: Self-pay | Admitting: Internal Medicine

## 2023-11-17 VITALS — BP 112/78 | HR 84 | Resp 16 | Ht 61.0 in | Wt 168.0 lb

## 2023-11-17 DIAGNOSIS — R569 Unspecified convulsions: Secondary | ICD-10-CM

## 2023-11-17 DIAGNOSIS — M719 Bursopathy, unspecified: Secondary | ICD-10-CM

## 2023-11-17 MED ORDER — IBUPROFEN 200 MG PO TABS: ORAL_TABLET | ORAL | Status: DC

## 2023-11-17 NOTE — Progress Notes (Signed)
    Subjective:    Patient ID: Emily [name], female   DOB: Jul 04, 1986, 37 y.o.   MRN: [id number]   HPI  Laveta Pottier interprets   Low back "ball"  with burning for past 2-3 weeks.  She states she did have a preceding injury.  Describes slipping and hitting her back/sliding down 5 steps.   She did have the "ball" there before, but did not have the burning discomfort until she fell.   The burning is only from the "ball"  No radiation up or down her back or leg.   No overlying redness or swelling.   No drainage from the "ball" The area is tender to palpation.   2.  Seizure:  no show for neurology in March.  She is to be seen later this month.  Continues on Levetiracetam .   She did have another seizure in her sleep more than 1 month ago, but not as severe per her husband--did not last as long.  She admits to missing the Levetriacetam here and there before the seizure.  3.  Breast tenderness:  She did not go to her gynecology appt she set up herself as was addressed at her appt here.  She is no longer having the tenderness.    Current Meds  Medication Sig   levETIRAcetam  (KEPPRA ) 500 MG tablet Take 1 tablet (500 mg total) by mouth 2 (two) times daily.   No Known Allergies   Review of Systems    Objective:   BP 112/78 (BP Location: Left Arm, Patient Position: Sitting, Cuff Size: Normal)   Pulse 84   Resp 16   Ht 5\' 1"  (1.549 m)   Wt 168 lb (76.2 kg)   LMP 11/02/2023   SpO2 98%   Breastfeeding No   BMI 31.74 kg/m   Physical Exam  NAD NT over spinous processes.   On right low back, upper mid right pelvic area just below pelvic brim, 1 cm soft tissue mass.  Palpation reproduces her discomfort.     Assessment & Plan   Likely right bursa over right posterior superior iliac spine with mild inflammation.  Recommend Ibuprofen  400-600 mg up to twice daily with meals as needed.  Stop pushing on the area.  2.  Seizure Disorder:  has had a second seizure now  after missing her Levetiracetam .  She has follow up with Neurology.  Encouraged her to call and cancel appts at least 24 hours prior to visit if she knows she will not make them.  3.  Breast tenderness:  resolved.

## 2023-11-17 NOTE — Telephone Encounter (Signed)
 APPT 11/17/2023

## 2023-11-20 ENCOUNTER — Ambulatory Visit: Payer: Self-pay | Admitting: Neurology

## 2023-12-12 ENCOUNTER — Other Ambulatory Visit: Payer: Self-pay

## 2023-12-12 DIAGNOSIS — Z0000 Encounter for general adult medical examination without abnormal findings: Secondary | ICD-10-CM

## 2023-12-13 LAB — LIPID PANEL W/O CHOL/HDL RATIO
Cholesterol, Total: 176 mg/dL (ref 100–199)
HDL: 52 mg/dL (ref >39)
LDL Chol Calc (NIH): 113 mg/dL — ABNORMAL HIGH (ref 0–99)
Triglycerides: 59 mg/dL (ref 0–149)
VLDL Cholesterol Cal: 11 mg/dL (ref 5–40)

## 2023-12-13 LAB — HEMOGLOBIN A1C
Est. average glucose Bld gHb Est-mCnc: 108 mg/dL
Hgb A1c MFr Bld: 5.4 % (ref 4.8–5.6)

## 2023-12-16 ENCOUNTER — Ambulatory Visit (INDEPENDENT_AMBULATORY_CARE_PROVIDER_SITE_OTHER): Payer: Self-pay | Admitting: Neurology

## 2023-12-16 ENCOUNTER — Encounter: Payer: Self-pay | Admitting: Neurology

## 2023-12-16 VITALS — BP 143/87 | HR 80 | Resp 14 | Ht 61.0 in | Wt 168.0 lb

## 2023-12-16 DIAGNOSIS — Z5181 Encounter for therapeutic drug level monitoring: Secondary | ICD-10-CM

## 2023-12-16 DIAGNOSIS — G40009 Localization-related (focal) (partial) idiopathic epilepsy and epileptic syndromes with seizures of localized onset, not intractable, without status epilepticus: Secondary | ICD-10-CM

## 2023-12-16 MED ORDER — LEVETIRACETAM 500 MG PO TABS: 500.0000 mg | ORAL_TABLET | Freq: Two times a day (BID) | ORAL | 1 refills | Status: DC

## 2023-12-16 MED ORDER — FOLIC ACID 1 MG PO TABS: 1.0000 mg | ORAL_TABLET | Freq: Every day | ORAL | 3 refills | Status: DC

## 2023-12-16 NOTE — Progress Notes (Signed)
 [facility]  PATIENT: Emily [name] ZOXWRUE DOB: 1986/06/07  REQUESTING CLINICIAN: Sonnie Dusky, PA-C HISTORY FROM: Patient via interpretor REASON FOR VISIT: Establish care for seizure    HISTORICAL  CHIEF COMPLAINT:  Chief Complaint  Patient presents with   New Patient (Initial Visit)    Rm12, interpreter present,  NP internal referral for Seizure-like activity:last episode was 3 months ago, pt mentioned missing medication prior to sz. Pt has been very sad while on keppra  denied thoughts of self harm     HISTORY OF PRESENT ILLNESS:  This is a 38 year old woman with no reported past medical history who is presenting for management of her seizure disorder.  She tells me her first seizure occurred in December.  This was out of sleep, around 6 AM.  She tells me the only thing that she remembers is going to bed fine and waking up in the ambulance.  Her husband told her that she was shaking, unresponsive when he called EMS.  Patient was taken to the hospital, had normal head CT and was started on Keppra .  She tells me in March she did have another seizure due to Keppra  nonadherence, this was again a seizure out of sleep.  Since then she has been taking the Keppra  but she is only taking it daily.  Fortunately for patient she has not had any additional seizure or seizure like activity.  She denies any history of seizures, denies any injury from her seizures, denies any seizure risk factors and no family history of seizures.   Handedness: Right handed   Onset: December 2024  Seizure Type: Generalized convulsion   Current frequency: Only twice, last event in March   Any injuries from seizures: Denies   Seizure risk factors: None reported   Previous ASMs: None   Currenty ASMs: Levetiracetam  500 twice daily   ASMs side effects: Reports feeling sad on the medication   Brain Images: Normal head CT   Previous EEGs: Not previously done    OTHER MEDICAL  CONDITIONS: None reported   REVIEW OF SYSTEMS: Full 14 system review of systems performed and negative with exception of: As noted in the HPI   ALLERGIES: No Known Allergies  HOME MEDICATIONS: Outpatient Medications Prior to Visit  Medication Sig Dispense Refill   ibuprofen  (ADVIL ) 200 MG tablet 2-3 tabletas cada 6 horas con comida a necesita dolor de espalda     levETIRAcetam  (KEPPRA ) 500 MG tablet Take 1 tablet (500 mg total) by mouth 2 (two) times daily. 60 tablet 1   No facility-administered medications prior to visit.    PAST MEDICAL HISTORY: Past Medical History:  Diagnosis Date   Medical history non-contributory     PAST SURGICAL HISTORY: Past Surgical History:  Procedure Laterality Date   NO PAST SURGERIES      FAMILY HISTORY: Family History  Problem Relation Age of Onset   Diabetes Father     SOCIAL HISTORY: Social History   Socioeconomic History   Marital status: Married    Spouse name: Verlean Glee   Number of children: 3   Years of education: 12   Highest education level: 12th grade  Occupational History   Occupation: Housewife  Tobacco Use   Smoking status: Never    Passive exposure: Never   Smokeless tobacco: Never  Vaping Use   Vaping status: Never Used  Substance and Sexual Activity   Alcohol use: Yes    Comment: Occasionl   Drug use: No   Sexual activity: Yes  Birth control/protection: I.U.D.  Other Topics Concern   Not on file  Social History Narrative   Lives at home with husband and 3 children   Social Drivers of Health   Financial Resource Strain: Low Risk  (08/05/2023)   Overall Financial Resource Strain (CARDIA)    Difficulty of Paying Living Expenses: Not very hard  Food Insecurity: No Food Insecurity (08/05/2023)   Hunger Vital Sign    Worried About Running Out of Food in the Last Year: Never true    Ran Out of Food in the Last Year: Never true  Transportation Needs: No Transportation Needs (08/05/2023)   PRAPARE -  Administrator, Civil Service (Medical): No    Lack of Transportation (Non-Medical): No  Physical Activity: Not on file  Stress: Not on file  Social Connections: Not on file  Intimate Partner Violence: Not At Risk (08/05/2023)   Humiliation, Afraid, Rape, and Kick questionnaire    Fear of Current or Ex-Partner: No    Emotionally Abused: No    Physically Abused: No    Sexually Abused: No    PHYSICAL EXAM  GENERAL EXAM/CONSTITUTIONAL: Vitals:  Vitals:   12/16/23 0939 12/16/23 0944  BP: 128/84 (!) 143/87  Pulse:  80  Resp:  14  SpO2:  98%  Weight:  168 lb (76.2 kg)  Height:  5\' 1"  (1.549 m)   Body mass index is 31.74 kg/m. Wt Readings from Last 3 Encounters:  12/16/23 168 lb (76.2 kg)  11/17/23 168 lb (76.2 kg)  08/05/23 159 lb (72.1 kg)   Patient is in no distress; well developed, nourished and groomed; neck is supple  MUSCULOSKELETAL: Gait, strength, tone, movements noted in Neurologic exam below  NEUROLOGIC: MENTAL STATUS:      No data to display         awake, alert, oriented to person, place and time recent and remote memory intact normal attention and concentration language fluent, comprehension intact, naming intact fund of knowledge appropriate  CRANIAL NERVE:  2nd, 3rd, 4th, 6th - Visual fields full to confrontation, extraocular muscles intact, no nystagmus 5th - facial sensation symmetric 7th - facial strength symmetric 8th - hearing intact 9th - palate elevates symmetrically, uvula midline 11th - shoulder shrug symmetric 12th - tongue protrusion midline  MOTOR:  normal bulk and tone, full strength in the BUE, BLE  SENSORY:  normal and symmetric to light touch  COORDINATION:  finger-nose-finger, fine finger movements normal  GAIT/STATION:  normal   DIAGNOSTIC DATA (LABS, IMAGING, TESTING) - I reviewed patient records, labs, notes, testing and imaging myself where available.  Lab Results  Component Value Date   WBC 5.1  06/25/2023   HGB 12.9 06/25/2023   HCT 39.0 06/25/2023   MCV 83.5 06/25/2023   PLT 199 06/25/2023      Component Value Date/Time   NA 136 06/25/2023 0616   K 3.9 06/25/2023 0616   CL 110 06/25/2023 0616   CO2 19 (L) 06/25/2023 0616   GLUCOSE 121 (H) 06/25/2023 0616   BUN 8 06/25/2023 0616   CREATININE 0.94 06/25/2023 0616   CALCIUM 8.6 (L) 06/25/2023 0616   GFRNONAA >60 06/25/2023 0616   Lab Results  Component Value Date   CHOL 176 12/12/2023   HDL 52 12/12/2023   LDLCALC 113 (H) 12/12/2023   TRIG 59 12/12/2023   Lab Results  Component Value Date   HGBA1C 5.4 12/12/2023   No results found for: "VITAMINB12" No results found for: "TSH"  Head  CT 06/25/2023:  No acute abnormalities.    ASSESSMENT AND PLAN  38 y.o. year old female  with with no reported past medical history who is presenting after 2 seizures out of sleep.  Patient likely does have epilepsy, will obtain a routine EEG for further classification' focal versus generalized.  She is taking levetiracetam  500 mg but only daily, advised her to increase the dose to 500 mg twice daily.  I will also give her a prescription for folic acid.  We also discussed driving restriction for total of 6 months.  She voiced understanding.  I will contact her to go over the EEG result, is abnormal we will obtain a brain MRI.  I will see her in 6 months for follow-up or sooner if worse.  Advised her to to contact me if she does have another breakthrough seizure, or side effects of the medication.    1. Partial idiopathic epilepsy with seizures of localized onset, not intractable, without status epilepticus (HCC)   2. Therapeutic drug monitoring     Patient Instructions  Continue with Levetiracetam  as prescribed 500 mg twice daily  Start folic acid 1 mg daily  Will obtain routine EEG  If abnormal, we will get a MRI Brain  Return in 6 months or sooner if worse. Please call if you have a seizure    Per   [place]V statutes,  patients with seizures are not allowed to drive until they have been seizure-free for six months.  Other recommendations include using caution when using heavy equipment or power tools. Avoid working on ladders or at heights. Take showers instead of baths.  Do not swim alone.  Ensure the water temperature is not too high on the home water heater. Do not go swimming alone. Do not lock yourself in a room alone (i.e. bathroom). When caring for infants or small children, sit down when holding, feeding, or changing them to minimize risk of injury to the child in the event you have a seizure. Maintain good sleep hygiene. Avoid alcohol.  Also recommend adequate sleep, hydration, good diet and minimize stress.   During the Seizure  - First, ensure adequate ventilation and place patients on the floor on their left side  Loosen clothing around the neck and ensure the airway is patent. If the patient is clenching the teeth, do not force the mouth open with any object as this can cause severe damage - Remove all items from the surrounding that can be hazardous. The patient may be oblivious to what's happening and may not even know what he or she is doing. If the patient is confused and wandering, either gently guide him/her away and block access to outside areas - Reassure the individual and be comforting - Call 911. In most cases, the seizure ends before EMS arrives. However, there are cases when seizures may last over 3 to 5 minutes. Or the individual may have developed breathing difficulties or severe injuries. If a pregnant patient or a person with diabetes develops a seizure, it is prudent to call an ambulance. - Finally, if the patient does not regain full consciousness, then call EMS. Most patients will remain confused for about 45 to 90 minutes after a seizure, so you must use judgment in calling for help. - Avoid restraints but make sure the patient is in a bed with padded side rails - Place the individual  in a lateral position with the neck slightly flexed; this will help the saliva drain from the mouth  and prevent the tongue from falling backward - Remove all nearby furniture and other hazards from the area - Provide verbal assurance as the individual is regaining consciousness - Provide the patient with privacy if possible - Call for help and start treatment as ordered by the caregiver   After the Seizure (Postictal Stage)  After a seizure, most patients experience confusion, fatigue, muscle pain and/or a headache. Thus, one should permit the individual to sleep. For the next few days, reassurance is essential. Being calm and helping reorient the person is also of importance.  Most seizures are painless and end spontaneously. Seizures are not harmful to others but can lead to complications such as stress on the lungs, brain and the heart. Individuals with prior lung problems may develop labored breathing and respiratory distress.    Discussed Patients with epilepsy have a small risk of sudden unexpected death, a condition referred to as sudden unexpected death in epilepsy (SUDEP). SUDEP is defined specifically as the sudden, unexpected, witnessed or unwitnessed, nontraumatic and nondrowning death in patients with epilepsy with or without evidence for a seizure, and excluding documented status epilepticus, in which post mortem examination does not reveal a structural or toxicologic cause for death     Orders Placed This Encounter  Procedures   Levetiracetam  level   EEG adult    Meds ordered this encounter  Medications   levETIRAcetam  (KEPPRA ) 500 MG tablet    Sig: Take 1 tablet (500 mg total) by mouth 2 (two) times daily.    Dispense:  60 tablet    Refill:  1   folic acid (FOLVITE) 1 MG tablet    Sig: Take 1 tablet (1 mg total) by mouth daily.    Dispense:  90 tablet    Refill:  3    Return in about 6 months (around 06/16/2024).    Cassandra Cleveland, MD 12/16/2023, 10:20 AM  [facility] [address] [place], [place] [address] [phone]

## 2023-12-16 NOTE — Patient Instructions (Signed)
 Continue with Levetiracetam  as prescribed 500 mg twice daily  Start folic acid 1 mg daily  Will obtain routine EEG  If abnormal, we will get a MRI Brain  Return in 6 months or sooner if worse. Please call if you have a seizure

## 2023-12-17 ENCOUNTER — Ambulatory Visit: Payer: Self-pay | Admitting: Neurology

## 2023-12-17 LAB — LEVETIRACETAM LEVEL: Levetiracetam Lvl: <2 ug/mL — ABNORMAL LOW (ref 10.0–40.0)

## 2023-12-18 ENCOUNTER — Other Ambulatory Visit: Payer: Self-pay | Admitting: Internal Medicine

## 2023-12-18 ENCOUNTER — Ambulatory Visit: Payer: Self-pay | Admitting: Internal Medicine

## 2023-12-18 ENCOUNTER — Encounter: Payer: Self-pay | Admitting: Internal Medicine

## 2023-12-18 VITALS — BP 100/60 | HR 78 | Resp 16 | Ht 61.0 in | Wt 177.0 lb

## 2023-12-18 DIAGNOSIS — Z1231 Encounter for screening mammogram for malignant neoplasm of breast: Secondary | ICD-10-CM

## 2023-12-18 DIAGNOSIS — N898 Other specified noninflammatory disorders of vagina: Secondary | ICD-10-CM

## 2023-12-18 DIAGNOSIS — G40009 Localization-related (focal) (partial) idiopathic epilepsy and epileptic syndromes with seizures of localized onset, not intractable, without status epilepticus: Secondary | ICD-10-CM

## 2023-12-18 DIAGNOSIS — Z0000 Encounter for general adult medical examination without abnormal findings: Secondary | ICD-10-CM

## 2023-12-18 DIAGNOSIS — Z124 Encounter for screening for malignant neoplasm of cervix: Secondary | ICD-10-CM

## 2023-12-18 LAB — POCT WET PREP WITH KOH
KOH Prep POC: NEGATIVE
RBC Wet Prep HPF POC: NEGATIVE
Trichomonas, UA: NEGATIVE
Yeast Wet Prep HPF POC: NEGATIVE

## 2023-12-18 MED ORDER — METRONIDAZOLE 500 MG PO TABS: ORAL_TABLET | ORAL | 0 refills | Status: DC

## 2023-12-18 NOTE — Progress Notes (Signed)
 Subjective:    Patient ID: Emily [name], female   DOB: February 08, 1986, 38 y.o.   MRN: [id number]   HPI  Emily [name] interprets  CPE with pap  1.  Pap:  Last pap in this chart was 08/2019 at beginning of last pregnancy and normal.  She subsequently had IUD placed in 2022 at [facility], but did not have pap.  2.  Mammogram:  Never.  Two maternal aunts with history of breast cancer in their 7s.  One still living at age 61 with successful treatment, the other died around 37 yo due to the cancer.    3.  Osteoprevention:  limited milk products intake.  She is willing to drink 3-4 servings of milk daily.  She does go to the gym each morning and walks outside 20-30 minutes in the afternoon daily.    4.  Guaiac Cards/FIT:  Never.    5.  Colonoscopy:  Never.  No family history of colon cancer.   6.  Immunizations:  Has not had influenza nor COVID vaccinations this past year.   Immunization History  Administered Date(s) Administered   Influenza,inj,Quad PF,6+ Mos 08/30/2013, 07/17/2022   PFIZER(Purple Top)SARS-COV-2 Vaccination 10/15/2019, 11/05/2019, 06/27/2020   Tdap 08/30/2013, 10/20/2015     7.  Glucose/Cholesterol:  Sugar was a bit high in December, but follow up A1C 5.4% 5/30.  Cholesterol profile fine. Lipid Panel     Component Value Date/Time   CHOL 176 12/12/2023 0902   TRIG 59 12/12/2023 0902   HDL 52 12/12/2023 0902   LDLCALC 113 (H) 12/12/2023 0902   LABVLDL 11 12/12/2023 0902   8.  Seizure Disorder:  did get into Neurology.  Keppra  level low.  She is to have EEG next week.  No seizure since March (2nd episode)  Current Meds  Medication Sig   folic acid (FOLVITE) 1 MG tablet Take 1 tablet (1 mg total) by mouth daily.   ibuprofen  (ADVIL ) 200 MG tablet 2-3 tabletas cada 6 horas con comida a necesita dolor de espalda   levETIRAcetam  (KEPPRA ) 500 MG tablet Take 1 tablet (500 mg total) by mouth 2 (two) times daily.   paragard intrauterine copper IUD IUD 1  each by Intrauterine route once. Placed beginning of [facility]22 at [facility].    No Known Allergies  Past Medical History:  Diagnosis Date   Partial idiopathic epilepsy with seizures of localized onset, not intractable, without status epilepticus (HCC) 06/25/2023   Past Surgical History:  Procedure Laterality Date   NO PAST SURGERIES     Family History  Problem Relation Age of Onset   Diabetes Father    Birth defects Maternal Aunt 41       Breast, successfully treated   Birth defects Maternal Aunt 85       breast from which she died around 83   Social History   Socioeconomic History   Marital status: Married    Spouse name: Verlean Glee   Number of children: 3   Years of education: 12   Highest education level: 12th grade  Occupational History   Occupation: Housewife  Tobacco Use   Smoking status: Never    Passive exposure: Never   Smokeless tobacco: Never  Vaping Use   Vaping status: Never Used  Substance and Sexual Activity   Alcohol use: Yes    Comment: once weekly1-3 beers   Drug use: No   Sexual activity: Yes    Birth control/protection: I.U.D.  Other Topics Concern   Not  on file  Social History Narrative   Lives at home with husband and 3 children   Social Drivers of Health   Financial Resource Strain: Low Risk  (08/05/2023)   Overall Financial Resource Strain (CARDIA)    Difficulty of Paying Living Expenses: Not very hard  Food Insecurity: No Food Insecurity (08/05/2023)   Hunger Vital Sign    Worried About Running Out of Food in the Last Year: Never true    Ran Out of Food in the Last Year: Never true  Transportation Needs: No Transportation Needs (08/05/2023)   PRAPARE - Administrator, Civil Service (Medical): No    Lack of Transportation (Non-Medical): No  Physical Activity: Not on file  Stress: Not on file  Social Connections: Not on file  Intimate Partner Violence: Not At Risk (08/05/2023)   Humiliation, Afraid, Rape, and Kick questionnaire     Fear of Current or Ex-Partner: No    Emotionally Abused: No    Physically Abused: No    Sexually Abused: No  No change in SDOH from January.   Review of Systems  Constitutional:        During exam, brings up concern for weight and questions about what to do.   Genitourinary:  Positive for vaginal discharge (During exam, states she has had vaginal discharge, white and sometimes with mild odor for past 3 months.  Husband without symptoms.  Some itching at times, no inflammation.).  All other systems reviewed and are negative.     Objective:   BP 100/60 (BP Location: Right Arm, Patient Position: Sitting, Cuff Size: Normal)   Pulse 78   Resp 16   Ht 5\' 1"  (1.549 m)   Wt 177 lb (80.3 kg)   LMP 12/01/2023   SpO2 98%   BMI 33.44 kg/m   Physical Exam Constitutional:      Appearance: She is obese.  HENT:     Head: Normocephalic and atraumatic.     Right Ear: Tympanic membrane, ear canal and external ear normal.     Left Ear: Ear canal and external ear normal. There is no impacted cerumen (Hard cerumen obscures much of left TM).     Nose: Nose normal.     Mouth/Throat:     Mouth: Mucous membranes are moist.     Pharynx: Oropharynx is clear.  Eyes:     Extraocular Movements: Extraocular movements intact.     Conjunctiva/sclera: Conjunctivae normal.     Pupils: Pupils are equal, round, and reactive to light.     Comments: Discs sharp  Neck:     Thyroid: No thyroid mass or thyromegaly.  Cardiovascular:     Rate and Rhythm: Normal rate and regular rhythm.     Heart sounds: S1 normal and S2 normal. No murmur heard.    No friction rub. No S3 or S4 sounds.     Comments: Carotid, radial, femoral, DP and PT pulses normal and equal.   Pulmonary:     Effort: Pulmonary effort is normal.     Breath sounds: Normal breath sounds and air entry.  Chest:  Breasts:    Right: No mass, nipple discharge or skin change.     Left: No mass, nipple discharge or skin change.  Abdominal:      General: Bowel sounds are normal.     Palpations: Abdomen is soft. There is no hepatomegaly, splenomegaly or mass.     Tenderness: There is no abdominal tenderness.     Hernia: No  hernia is present.  Genitourinary:    Comments: Normal external female genitalia with copious thin white to yellow discharge at introitus.   Discharge thickens toward cervix. No obvious odor. Cervix with generalized inflammation IUD strings exiting from os No CMT No uterine or adnexal mass or tenderness.   Musculoskeletal:        General: Normal range of motion.     Cervical back: Normal range of motion and neck supple.     Right lower leg: No edema.     Left lower leg: No edema.  Lymphadenopathy:     Head:     Right side of head: No submental or submandibular adenopathy.     Left side of head: No submental or submandibular adenopathy.     Cervical: No cervical adenopathy.     Upper Body:     Right upper body: No supraclavicular or axillary adenopathy.     Left upper body: No supraclavicular or axillary adenopathy.     Lower Body: No right inguinal adenopathy. No left inguinal adenopathy.  Skin:    General: Skin is warm.     Capillary Refill: Capillary refill takes less than 2 seconds.     Findings: No rash.  Neurological:     General: No focal deficit present.     Mental Status: She is alert and oriented to person, place, and time.     Cranial Nerves: Cranial nerves 2-12 are intact.     Sensory: Sensation is intact.     Motor: Motor function is intact.     Coordination: Coordination is intact.     Gait: Gait is intact.     Deep Tendon Reflexes: Reflexes are normal and symmetric.  Psychiatric:        Mood and Affect: Mood normal.        Behavior: Behavior normal. Behavior is cooperative.      Assessment & Plan   CPE with pap List for COVID vaccination call back Encouraged influenza and COVID in Sept/Oct.  2.  Vaginal Discharge:  appears to be BV as no trich on wet prep, but did have  inflammation of cervix and many WBCs on wet prep.  Sending GC/chlamydia as well.  Strong + whiff.  Metronidazole  500 mg twice daily for 7 days.  Call if does not resolve.  3.  Obesity:  Dietary and physical activity goals to improve weekly  4.  Hard left ear cerumen:  stop using qtips in ears.  Call if hearing decreases on this side.  5.  Seizure disorder:  as per Neurology.

## 2023-12-18 NOTE — Patient Instructions (Signed)
 Tome un vaso de agua antes de cada comida Tome un minimo de 6 a 8 vasos de agua diarios Coma tres veces al dia Coma una proteina y Ebb Goldman grasa saludable con comida.  (huevos, pescado, pollo, pavo, y limite carnes rojas Coma 5 porciones diarias de legumbres.  Mezcle los colores Coma 2 porciones diarias de frutas con cascara cuando sea comestible Use platos pequeos Suelte su tenedor o cuchara despues de cada mordida hata que se mastique y se trague Come en la mesa con amigos o familiares por lo menos una vez al dia Apague la televisin y aparatos electrnicos durante la comida  Su objetivo debe ser perder una libra por semana  Estudios recientes indican que las personas quienes consumen todos de sus calorias durante 12 horas se bajan de pesocon Mas eficiencia.  Por ejemplo, si Usted come su primera comida a las 7:00 a.m., su comida final del dia se debe completar antes de las 7:00 p.m.  Nueva metas cada Lunes--escribe

## 2023-12-22 ENCOUNTER — Ambulatory Visit: Payer: Self-pay | Admitting: Neurology

## 2023-12-22 LAB — GC/CHLAMYDIA PROBE AMP
Chlamydia trachomatis, NAA: NEGATIVE
Neisseria Gonorrhoeae by PCR: NEGATIVE

## 2023-12-23 ENCOUNTER — Ambulatory Visit (INDEPENDENT_AMBULATORY_CARE_PROVIDER_SITE_OTHER): Payer: Self-pay | Admitting: Neurology

## 2023-12-23 DIAGNOSIS — Z5181 Encounter for therapeutic drug level monitoring: Secondary | ICD-10-CM

## 2023-12-23 DIAGNOSIS — G40009 Localization-related (focal) (partial) idiopathic epilepsy and epileptic syndromes with seizures of localized onset, not intractable, without status epilepticus: Secondary | ICD-10-CM

## 2023-12-23 NOTE — Procedures (Signed)
   History:  38 year old woman with epilepsy   EEG classification:  Awake and asleep  Duration: 28 minutes   Technical aspects: This EEG study was done with scalp electrodes positioned according to the 10-20 International system of electrode placement. Electrical activity was reviewed with band pass filter of 1-70Hz , sensitivity of 7 uV/mm, display speed of 5mm/sec with a 60Hz  notched filter applied as appropriate. EEG data were recorded continuously and digitally stored.   Description of the recording: The background rhythms of this recording consists of a fairly well modulated medium amplitude background activity of 10 Hz. As the record progresses, the patient initially is in the waking state, but appears to enter the early stage II sleep during the recording, with rudimentary sleep spindles and vertex sharp wave activity seen. During the wakeful state, photic stimulation was performed, and no abnormal responses were seen. Hyperventilation was also performed, no abnormal response seen. No epileptiform discharges seen during this recording. There was no focal slowing.   Abnormality: None   Impression: This is a normal awake and sleep EEG. No evidence of interictal epileptiform discharges. Normal EEGs, however, do not rule out epilepsy.    Pheobe Sandiford, MD [facility]

## 2023-12-26 LAB — CYTOLOGY - PAP

## 2024-01-04 ENCOUNTER — Ambulatory Visit: Payer: Self-pay | Admitting: Internal Medicine

## 2024-04-15 ENCOUNTER — Other Ambulatory Visit: Payer: Self-pay | Admitting: Internal Medicine

## 2024-04-15 ENCOUNTER — Other Ambulatory Visit: Payer: Self-pay | Admitting: Neurology

## 2024-06-15 ENCOUNTER — Ambulatory Visit: Payer: Self-pay | Admitting: Internal Medicine

## 2024-06-15 ENCOUNTER — Encounter: Payer: Self-pay | Admitting: Internal Medicine

## 2024-06-15 VITALS — BP 130/80 | HR 79 | Resp 16 | Ht 60.5 in | Wt 160.0 lb

## 2024-06-15 DIAGNOSIS — N644 Mastodynia: Secondary | ICD-10-CM

## 2024-06-15 MED ORDER — IBUPROFEN 200 MG PO TABS: ORAL_TABLET | ORAL | Status: AC

## 2024-06-15 NOTE — Progress Notes (Unsigned)
 Lungs CTA Unable to palpate abnormality of left breast.

## 2024-06-15 NOTE — Progress Notes (Unsigned)
 Acute Office Visit  Subjective:  Emily [name] interprets I, MD in training, did H/P then presented to Dr. CHRISTELLA. She did her own eval and did final A/P, orders and directly supervised me as I did discharge instructions. Pt conveyed undrastaniding   Patient ID: Emily [name], female    DOB: 06-02-1986, 38 y.o.   MRN: [id number]  Chief Complaint  Patient presents with   Breast Pain    Pain on the left breast     3 weeks of constant left breast pain that is getting worse.  6/10 when started. Now 8/10. No bumps. Onset was sudden while lying down on the left side. No PMH of this.  The breast seems a little larger compared to the right to the pt. No fever, drainage.  Radiates to the left lower interscapular area and goes through to the other side.  Worsens a little with palpation but not movement of the arm or with deep breaths or any other exacerbating factors.   No ameliorating factors.  Aunts with breast cancer, one was 48 and other 55 breast cancers were diagnosed in them. Pt has never had a mammogram.  No fall or trauma. Has IUD with regualr periods, LMP about a month ago.  It is due now. 3 times pregnant. Does not feel pregnant.  No morning sickness or nausea.     Review of Systems  Constitutional:  Negative for fever and weight loss.       No loss of appetite  Gastrointestinal:  Negative for nausea.    No Known Allergies  Current Meds  Medication Sig   folic acid  (FOLVITE ) 1 MG tablet Take 1 tablet (1 mg total) by mouth daily.   ibuprofen  (ADVIL ) 200 MG tablet 2-3 tabletas cada 6 horas con comida a necesita dolor de espalda   levETIRAcetam  (KEPPRA ) 500 MG tablet Take 1 tablet by mouth twice daily   paragard intrauterine copper IUD IUD 1 each by Intrauterine route once. Placed beginning of 2022 at [facility].    Patient Active Problem List   Diagnosis Date Noted   Partial idiopathic epilepsy with seizures of localized onset, not intractable, without status epilepticus (HCC)  06/25/2023   Language barrier 08/16/2019       Objective:    BP 130/80 (BP Location: Right Arm, Patient Position: Sitting, Cuff Size: Normal)   Pulse 79   Resp 16   Ht 5' 0.5 (1.537 m)   Wt 160 lb (72.6 kg)   BMI 30.73 kg/m    Physical Exam Vitals and nursing note reviewed.  Constitutional:      General: She is not in acute distress.    Appearance: Normal appearance. She is not ill-appearing or toxic-appearing.     Comments: High BMI  Pulmonary:     Effort: Pulmonary effort is normal.     Comments: Breath sounds normal per Dr. CHRISTELLA Chest:       Comments: Mild general tenderness in the above area. At least 1, 1 by 1 cm freely movable cyst palpable with fibrocystic changes.  Right breast nontender.   Breast look the same size.   Left nipple normal Musculoskeletal:     Comments: + tenderness of the left lower interscapular area.  No increase in pain with ROM of the shoulders and rhomboids. No swelling or deformity or focal tenderness  Lymphadenopathy:     Upper Body:     Right upper body: No axillary adenopathy.     Left upper body: No axillary adenopathy.  Skin:    Comments: Left breast with normal skin without erythema, drainage, dimpling, peau d'orange; compared to the right breast.  Normal nipple in the left breast.   No skin changes over the left inter-scapular area.  Neurological:     Mental Status: She is alert.  Psychiatric:     Comments: Normal mentation, judgement and  behavior based on the ability to cooperate with exam and give a good H/P and follow instructions during exam.        Assessment & Plan:   Breast pain, left - Plan: MS 3D DIAG MAMMO BILAT BR (aka MM) - acute Ibuprofen  600 q 6h for 5 days then q6h prn.  RTC if worse.  Caron Kaiser, MD

## 2024-06-16 ENCOUNTER — Telehealth: Payer: Self-pay

## 2024-06-22 ENCOUNTER — Telehealth: Payer: Self-pay | Admitting: General Practice

## 2024-06-22 NOTE — Telephone Encounter (Signed)
 Patient states she was seen a week ago for breast pain and was told she was going to get refer for a mammogram and she was to get a call from their office to schedule an appointment, patient is calling today to report that she has not received a call from mammogram office and would like to know what she can do.

## 2024-06-22 NOTE — Telephone Encounter (Signed)
 We have sent her application to [employer] program on Friday, She might her recive their call this week. In case that she does not, ask her to call us  so we can contact [employer]

## 2024-06-29 NOTE — Telephone Encounter (Signed)
 Patient has been notified referral for mammogram was sent before 06/22/24 and they should be calling her with an appointment.  Patient states she has not received call from them yet but will continue to wait for call.

## 2024-07-01 NOTE — Telephone Encounter (Signed)
 Error. No pt encounter.

## 2024-07-12 ENCOUNTER — Telehealth: Payer: Self-pay

## 2024-07-20 DIAGNOSIS — N644 Mastodynia: Secondary | ICD-10-CM

## 2024-07-20 NOTE — Telephone Encounter (Signed)
 Spoke to pt to see if she got her mammo appointment?   She said no.   Breast sxs unchanged. Told pt I will check on it.  Pt speaks some english and did understand question and answered.  I emailed Lela Hector at [facility] to see if they have received the faxed application for the scholarship.  Awaiting email response back to me.

## 2024-07-22 NOTE — Telephone Encounter (Cosign Needed)
 Received email back from [employer].  Pt has appt Jan. 27, 2026.  [employer] said pt was informed of this before.   [employer] said she would re-inform the pt of the appointment in Spanish so she can understand.

## 2024-07-22 NOTE — Telephone Encounter (Cosign Needed)
 Got another note from [facility] this AM that pt was called this AM about her Mammo appointment using a Spanish interpretor and that pt verbalized understanding.  Appt is Aug 06, 2024 930am [facility], [facility] [address], for the administrative part   Mammo Aug 10, 2024 1240pm.

## 2024-07-26 ENCOUNTER — Encounter: Payer: Self-pay | Admitting: Neurology

## 2024-07-26 ENCOUNTER — Ambulatory Visit (INDEPENDENT_AMBULATORY_CARE_PROVIDER_SITE_OTHER): Payer: Self-pay | Admitting: Neurology

## 2024-07-26 VITALS — BP 121/78 | HR 79 | Ht 61.0 in | Wt 170.0 lb

## 2024-07-26 DIAGNOSIS — G40009 Localization-related (focal) (partial) idiopathic epilepsy and epileptic syndromes with seizures of localized onset, not intractable, without status epilepticus: Secondary | ICD-10-CM

## 2024-07-26 MED ORDER — LEVETIRACETAM 500 MG PO TABS: 500.0000 mg | ORAL_TABLET | Freq: Two times a day (BID) | ORAL | 3 refills | Status: AC

## 2024-07-26 MED ORDER — FOLIC ACID 1 MG PO TABS: 1.0000 mg | ORAL_TABLET | Freq: Every day | ORAL | 3 refills | Status: AC

## 2024-07-26 NOTE — Progress Notes (Signed)
 "  [facility]  PATIENT: Emily [name] DOB: [birth year]  REQUESTING CLINICIAN: No ref. provider found HISTORY FROM: Patient via interpretor REASON FOR VISIT: Establish care for seizure    HISTORICAL  CHIEF COMPLAINT:  Chief Complaint  Patient presents with   Follow-up    Room 12 With cone translator Partial idiopathic epilepsy with seizures of localized onset, not intractable, without status epilepticus- last sz:09/2023    INTERVAL HISTORY 07/26/2024 Patient presents today for follow-up, she is alone.  Last visit was in June, since then she has been doing well.  She is compliant with the Keppra  500 mg twice daily.  Tells me she did have a little bit of sadness and tiredness but those symptom are no longer present.  She denies any seizure or seizure like activity.  No additional complaints or concerns.   HISTORY OF PRESENT ILLNESS:  This is a 39 year old woman with no reported past medical history who is presenting for management of her seizure disorder.  She tells me her first seizure occurred in December.  This was out of sleep, around 6 AM.  She tells me the only thing that she remembers is going to bed fine and waking up in the ambulance.  Her husband told her that she was shaking, unresponsive when he called EMS.  Patient was taken to the hospital, had normal head CT and was started on Keppra .  She tells me in March she did have another seizure due to Keppra  nonadherence, this was again a seizure out of sleep.  Since then she has been taking the Keppra  but she is only taking it daily.  Fortunately for patient she has not had any additional seizure or seizure like activity.  She denies any history of seizures, denies any injury from her seizures, denies any seizure risk factors and no family history of seizures.   Handedness: Right handed   Onset: December 2024  Seizure Type: Generalized convulsion   Current frequency: Only twice, last event in  March   Any injuries from seizures: Denies   Seizure risk factors: None reported   Previous ASMs: None   Currenty ASMs: Levetiracetam  500 twice daily   ASMs side effects: Reports feeling sad on the medication   Brain Images: Normal head CT   Previous EEGs: Not previously done    OTHER MEDICAL CONDITIONS: None reported   REVIEW OF SYSTEMS: Full 14 system review of systems performed and negative with exception of: As noted in the HPI   ALLERGIES: No Known Allergies  HOME MEDICATIONS: Outpatient Medications Prior to Visit  Medication Sig Dispense Refill   ibuprofen  (ADVIL ) 200 MG tablet 2-3 tabletas dos veces al dia con comida para 5 dias y despues:  cada 6 horas con comida cuando necesita para dolor     metroNIDAZOLE  (FLAGYL ) 500 MG tablet 1 tab by mouth twice daily for 7 days 14 tablet 0   paragard intrauterine copper IUD IUD 1 each by Intrauterine route once. Placed beginning of 2022 at [facility].     folic acid  (FOLVITE ) 1 MG tablet Take 1 tablet (1 mg total) by mouth daily. 90 tablet 3   levETIRAcetam  (KEPPRA ) 500 MG tablet Take 1 tablet by mouth twice daily 60 tablet 5   No facility-administered medications prior to visit.    PAST MEDICAL HISTORY: Past Medical History:  Diagnosis Date   Partial idiopathic epilepsy with seizures of localized onset, not intractable, without status epilepticus (HCC) 06/25/2023    PAST SURGICAL HISTORY: Past  Surgical History:  Procedure Laterality Date   NO PAST SURGERIES      FAMILY HISTORY: Family History  Problem Relation Age of Onset   Diabetes Father    Birth defects Maternal Aunt 61       Breast, successfully treated   Birth defects Maternal Aunt 5       breast from which she died around 45    SOCIAL HISTORY: Social History   Socioeconomic History   Marital status: Married    Spouse name: Camillia   Number of children: 3   Years of education: 12   Highest education level: 12th grade  Occupational History    Occupation: Housewife  Tobacco Use   Smoking status: Never    Passive exposure: Never   Smokeless tobacco: Never  Vaping Use   Vaping status: Never Used  Substance and Sexual Activity   Alcohol use: Yes    Comment: once weekly1-3 beers   Drug use: No   Sexual activity: Yes    Birth control/protection: I.U.D.  Other Topics Concern   Not on file  Social History Narrative   Lives at home with husband and 3 children   Social Drivers of Health   Tobacco Use: Low Risk (07/26/2024)   Patient History    Smoking Tobacco Use: Never    Smokeless Tobacco Use: Never    Passive Exposure: Never  Financial Resource Strain: Low Risk (08/05/2023)   Overall Financial Resource Strain (CARDIA)    Difficulty of Paying Living Expenses: Not very hard  Food Insecurity: No Food Insecurity (08/05/2023)   Hunger Vital Sign    Worried About Running Out of Food in the Last Year: Never true    Ran Out of Food in the Last Year: Never true  Transportation Needs: No Transportation Needs (08/05/2023)   PRAPARE - Administrator, Civil Service (Medical): No    Lack of Transportation (Non-Medical): No  Physical Activity: Not on file  Stress: Not on file  Social Connections: Not on file  Intimate Partner Violence: Not At Risk (08/05/2023)   Humiliation, Afraid, Rape, and Kick questionnaire    Fear of Current or Ex-Partner: No    Emotionally Abused: No    Physically Abused: No    Sexually Abused: No  Depression (PHQ2-9): Not on file  Alcohol Screen: Not on file  Housing: Unknown (08/05/2023)   Housing Stability Vital Sign    Unable to Pay for Housing in the Last Year: No    Number of Times Moved in the Last Year: Not on file    Homeless in the Last Year: No  Utilities: Not At Risk (08/05/2023)   AHC Utilities    Threatened with loss of utilities: No  Health Literacy: Not on file    PHYSICAL EXAM  GENERAL EXAM/CONSTITUTIONAL: Vitals:  Vitals:   07/26/24 1154  BP: 121/78  Pulse: 79  SpO2:  97%  Weight: 170 lb (77.1 kg)  Height: 5' 1 (1.549 m)    Body mass index is 32.12 kg/m. Wt Readings from Last 3 Encounters:  07/26/24 170 lb (77.1 kg)  06/15/24 160 lb (72.6 kg)  12/18/23 177 lb (80.3 kg)   Patient is in no distress; well developed, nourished and groomed; neck is supple  MUSCULOSKELETAL: Gait, strength, tone, movements noted in Neurologic exam below  NEUROLOGIC: MENTAL STATUS:      No data to display         awake, alert, oriented to person, place and time recent and  remote memory intact normal attention and concentration language fluent, comprehension intact, naming intact fund of knowledge appropriate  CRANIAL NERVE:  2nd, 3rd, 4th, 6th - Visual fields full to confrontation, extraocular muscles intact, no nystagmus 5th - facial sensation symmetric 7th - facial strength symmetric 8th - hearing intact 9th - palate elevates symmetrically, uvula midline 11th - shoulder shrug symmetric 12th - tongue protrusion midline  MOTOR:  normal bulk and tone, full strength in the BUE, BLE  SENSORY:  normal and symmetric to light touch  COORDINATION:  finger-nose-finger, fine finger movements normal  GAIT/STATION:  normal   DIAGNOSTIC DATA (LABS, IMAGING, TESTING) - I reviewed patient records, labs, notes, testing and imaging myself where available.  Lab Results  Component Value Date   WBC 5.1 06/25/2023   HGB 12.9 06/25/2023   HCT 39.0 06/25/2023   MCV 83.5 06/25/2023   PLT 199 06/25/2023      Component Value Date/Time   NA 136 06/25/2023 0616   K 3.9 06/25/2023 0616   CL 110 06/25/2023 0616   CO2 19 (L) 06/25/2023 0616   GLUCOSE 121 (H) 06/25/2023 0616   BUN 8 06/25/2023 0616   CREATININE 0.94 06/25/2023 0616   CALCIUM 8.6 (L) 06/25/2023 0616   GFRNONAA >60 06/25/2023 0616   Lab Results  Component Value Date   CHOL 176 12/12/2023   HDL 52 12/12/2023   LDLCALC 113 (H) 12/12/2023   TRIG 59 12/12/2023   Lab Results  Component Value  Date   HGBA1C 5.4 12/12/2023   No results found for: VITAMINB12 No results found for: TSH  Head CT 06/25/2023:  No acute abnormalities.   Routine EEG 12/23/2023 Normal    ASSESSMENT AND PLAN  39 y.o. year old female  with epilepsy who is presenting for follow-up.  Since last visit in June she has been doing well.  Denies any seizure or seizure activity.  She is compliant with her Keppra  500 mg twice daily.  Plan will be for patient to continue with Keppra , folic acid  and I will see her in 1 year for follow-up or sooner if worse.  I did advise her to contact me if she does have any additional seizures.  She is comfortable with plan.  Since   1. Partial idiopathic epilepsy with seizures of localized onset, not intractable, without status epilepticus (HCC)      Patient Instructions  Continue with levetiracetam  500 mg twice daily, refill given Continue folic acid  1 g daily Please contact us  if you do have another seizure Follow-up in a year or sooner if worse.   Per [place] statutes, patients with seizures are not allowed to drive until they have been seizure-free for six months.  Other recommendations include using caution when using heavy equipment or power tools. Avoid working on ladders or at heights. Take showers instead of baths.  Do not swim alone.  Ensure the water temperature is not too high on the home water heater. Do not go swimming alone. Do not lock yourself in a room alone (i.e. bathroom). When caring for infants or small children, sit down when holding, feeding, or changing them to minimize risk of injury to the child in the event you have a seizure. Maintain good sleep hygiene. Avoid alcohol.  Also recommend adequate sleep, hydration, good diet and minimize stress.   During the Seizure  - First, ensure adequate ventilation and place patients on the floor on their left side  Loosen clothing around the neck and ensure  the airway is patent. If the patient is  clenching the teeth, do not force the mouth open with any object as this can cause severe damage - Remove all items from the surrounding that can be hazardous. The patient may be oblivious to what's happening and may not even know what he or she is doing. If the patient is confused and wandering, either gently guide him/her away and block access to outside areas - Reassure the individual and be comforting - Call 911. In most cases, the seizure ends before EMS arrives. However, there are cases when seizures may last over 3 to 5 minutes. Or the individual may have developed breathing difficulties or severe injuries. If a pregnant patient or a person with diabetes develops a seizure, it is prudent to call an ambulance. - Finally, if the patient does not regain full consciousness, then call EMS. Most patients will remain confused for about 45 to 90 minutes after a seizure, so you must use judgment in calling for help. - Avoid restraints but make sure the patient is in a bed with padded side rails - Place the individual in a lateral position with the neck slightly flexed; this will help the saliva drain from the mouth and prevent the tongue from falling backward - Remove all nearby furniture and other hazards from the area - Provide verbal assurance as the individual is regaining consciousness - Provide the patient with privacy if possible - Call for help and start treatment as ordered by the caregiver   After the Seizure (Postictal Stage)  After a seizure, most patients experience confusion, fatigue, muscle pain and/or a headache. Thus, one should permit the individual to sleep. For the next few days, reassurance is essential. Being calm and helping reorient the person is also of importance.  Most seizures are painless and end spontaneously. Seizures are not harmful to others but can lead to complications such as stress on the lungs, brain and the heart. Individuals with prior lung problems may develop  labored breathing and respiratory distress.    Discussed Patients with epilepsy have a small risk of sudden unexpected death, a condition referred to as sudden unexpected death in epilepsy (SUDEP). SUDEP is defined specifically as the sudden, unexpected, witnessed or unwitnessed, nontraumatic and nondrowning death in patients with epilepsy with or without evidence for a seizure, and excluding documented status epilepticus, in which post mortem examination does not reveal a structural or toxicologic cause for death     No orders of the defined types were placed in this encounter.   Meds ordered this encounter  Medications   folic acid  (FOLVITE ) 1 MG tablet    Sig: Take 1 tablet (1 mg total) by mouth daily.    Dispense:  90 tablet    Refill:  3   levETIRAcetam  (KEPPRA ) 500 MG tablet    Sig: Take 1 tablet (500 mg total) by mouth 2 (two) times daily.    Dispense:  180 tablet    Refill:  3    Return in about 1 year (around 07/26/2025).    Pastor Falling, MD 07/26/2024, 12:15 PM  [facility] [address] [place], [place] [address] [phone]  "

## 2024-07-26 NOTE — Patient Instructions (Signed)
 Continue with levetiracetam  500 mg twice daily, refill given Continue folic acid  1 g daily Please contact us  if you do have another seizure Follow-up in a year or sooner if worse.

## 2024-08-06 ENCOUNTER — Ambulatory Visit: Payer: Self-pay | Admitting: Nurse Practitioner

## 2024-08-06 VITALS — BP 125/79 | Wt 168.0 lb

## 2024-08-06 DIAGNOSIS — N644 Mastodynia: Secondary | ICD-10-CM

## 2024-08-06 NOTE — Progress Notes (Signed)
 Ms. Emily [name] is a 39 y.o. female who presents to [facility] clinic today with complaint of intermittent left breast pain x3 months, up to 7/10 at times. Pain worsens with menstrual cycle. Denies lump, skin change, nipple discharge.    Pap Smear: Pap not smear completed today. Last Pap smear was 12/18/23 at [facility] clinic and was normal. NILM/HPV negative on 08/16/19 at [facility]. Last Pap smear result is available in Epic.   Physical exam: Breasts Breasts symmetrical without nipple discharge, inversion, or retraction. No skin abnormalities. Tenderness to palpation at the left outer breast 3-4 o'clock position. No palpable mass or nodularity in either breast or axilla that I could appreciate    Pelvic/Bimanual Pap is not indicated today    Smoking History: Patient is a non smoker.    Patient Navigation: Patient education provided. Access to services provided for patient through [facility] program. Emily [name], Spanish interpreter provided.    Colorectal Cancer Screening: Per patient has never had colonoscopy completed No complaints today.    Breast and Cervical Cancer Risk Assessment: Patient has family history of breast cancer in maternal aunt x2 (ages 73, 48), no known genetic mutations, and no radiation treatment to the chest before age 21. Patient does not have history of cervical dysplasia, immunocompromised, or DES exposure in-utero.   Risk Scores as of Encounter on 08/06/2024     Emily [name]           5-year 0.44%   Lifetime 11.24%   This patient is [place] but has no documented birth country, so the [place] model used data from [place] patients to calculate their risk score. Document a birth country in the Demographics activity for a more accurate score.         Last calculated by Emily [name], CMA on 08/06/2024 at  9:47 AM        A: B[facility]CP exam without pap smear Complaint of intermittent left breast pain worse with menstrual cycle. Tender on  exam without palpable mass.   P: Patient scheduled for diagnostic L mammogram/US  at the [facility] on 08/10/2024   Emily Tierce K, NP 08/06/2024

## 2024-08-10 ENCOUNTER — Other Ambulatory Visit: Payer: Self-pay | Admitting: Obstetrics and Gynecology

## 2024-08-10 ENCOUNTER — Ambulatory Visit
Admission: RE | Admit: 2024-08-10 | Discharge: 2024-08-10 | Disposition: A | Source: Ambulatory Visit | Attending: Obstetrics and Gynecology | Admitting: Obstetrics and Gynecology

## 2024-08-10 ENCOUNTER — Ambulatory Visit
Admission: RE | Admit: 2024-08-10 | Discharge: 2024-08-10 | Disposition: A | Payer: Self-pay | Source: Ambulatory Visit | Attending: Obstetrics and Gynecology | Admitting: Obstetrics and Gynecology

## 2024-08-10 DIAGNOSIS — N644 Mastodynia: Secondary | ICD-10-CM

## 2024-08-10 DIAGNOSIS — R921 Mammographic calcification found on diagnostic imaging of breast: Secondary | ICD-10-CM

## 2024-12-17 ENCOUNTER — Other Ambulatory Visit: Payer: Self-pay

## 2024-12-22 ENCOUNTER — Encounter: Payer: Self-pay | Admitting: Internal Medicine

## 2025-02-09 ENCOUNTER — Encounter

## 2025-07-26 ENCOUNTER — Ambulatory Visit: Payer: Self-pay | Admitting: Neurology
